# Patient Record
Sex: Female | Born: 1970 | Race: Black or African American | Hispanic: No | Smoking: Never smoker
Health system: Southern US, Community
[De-identification: ages and names within clinical notes are randomized; demographics above are authoritative.]

## PROBLEM LIST (undated history)

## (undated) DIAGNOSIS — R011 Cardiac murmur, unspecified: Secondary | ICD-10-CM

## (undated) DIAGNOSIS — R112 Nausea with vomiting, unspecified: Secondary | ICD-10-CM

## (undated) DIAGNOSIS — Z8759 Personal history of other complications of pregnancy, childbirth and the puerperium: Secondary | ICD-10-CM

## (undated) DIAGNOSIS — Z9889 Other specified postprocedural states: Secondary | ICD-10-CM

## (undated) DIAGNOSIS — D649 Anemia, unspecified: Secondary | ICD-10-CM

## (undated) HISTORY — PX: COLPOSCOPY: SHX161

## (undated) HISTORY — PX: INTRAUTERINE DEVICE INSERTION: SHX323

---

## 2005-12-13 ENCOUNTER — Inpatient Hospital Stay (HOSPITAL_COMMUNITY): Admission: AD | Admit: 2005-12-13 | Discharge: 2005-12-13 | Payer: Self-pay | Admitting: Obstetrics & Gynecology

## 2005-12-19 ENCOUNTER — Inpatient Hospital Stay (HOSPITAL_COMMUNITY): Admission: AD | Admit: 2005-12-19 | Discharge: 2005-12-21 | Payer: Self-pay | Admitting: Obstetrics and Gynecology

## 2006-01-05 ENCOUNTER — Inpatient Hospital Stay (HOSPITAL_COMMUNITY): Admission: AD | Admit: 2006-01-05 | Discharge: 2006-01-05 | Payer: Self-pay | Admitting: Obstetrics & Gynecology

## 2006-01-06 ENCOUNTER — Inpatient Hospital Stay (HOSPITAL_COMMUNITY): Admission: AD | Admit: 2006-01-06 | Discharge: 2006-01-06 | Payer: Self-pay | Admitting: Obstetrics and Gynecology

## 2006-01-07 ENCOUNTER — Inpatient Hospital Stay (HOSPITAL_COMMUNITY): Admission: AD | Admit: 2006-01-07 | Discharge: 2006-01-07 | Payer: Self-pay | Admitting: Obstetrics and Gynecology

## 2008-05-26 ENCOUNTER — Emergency Department (HOSPITAL_BASED_OUTPATIENT_CLINIC_OR_DEPARTMENT_OTHER): Admission: EM | Admit: 2008-05-26 | Discharge: 2008-05-26 | Payer: Self-pay | Admitting: Emergency Medicine

## 2008-10-02 ENCOUNTER — Emergency Department (HOSPITAL_BASED_OUTPATIENT_CLINIC_OR_DEPARTMENT_OTHER): Admission: EM | Admit: 2008-10-02 | Discharge: 2008-10-02 | Payer: Self-pay | Admitting: Emergency Medicine

## 2009-04-08 ENCOUNTER — Emergency Department (HOSPITAL_BASED_OUTPATIENT_CLINIC_OR_DEPARTMENT_OTHER): Admission: EM | Admit: 2009-04-08 | Discharge: 2009-04-09 | Payer: Self-pay | Admitting: Emergency Medicine

## 2010-03-15 ENCOUNTER — Other Ambulatory Visit: Payer: Self-pay | Admitting: Obstetrics & Gynecology

## 2010-03-15 DIAGNOSIS — R928 Other abnormal and inconclusive findings on diagnostic imaging of breast: Secondary | ICD-10-CM

## 2010-03-27 ENCOUNTER — Other Ambulatory Visit: Payer: Self-pay

## 2010-04-11 ENCOUNTER — Ambulatory Visit
Admission: RE | Admit: 2010-04-11 | Discharge: 2010-04-11 | Disposition: A | Discharge status: Home / Self Care | Payer: BC Managed Care – PPO | Source: Ambulatory Visit | Attending: Obstetrics & Gynecology | Admitting: Obstetrics & Gynecology

## 2010-04-11 DIAGNOSIS — R928 Other abnormal and inconclusive findings on diagnostic imaging of breast: Secondary | ICD-10-CM

## 2010-04-16 LAB — URINALYSIS, ROUTINE W REFLEX MICROSCOPIC
Glucose, UA: NEGATIVE mg/dL
Nitrite: POSITIVE — AB
Protein, ur: >300 mg/dL — AB
pH: 6 (ref 5.0–8.0)

## 2010-04-16 LAB — URINE CULTURE

## 2010-07-17 ENCOUNTER — Emergency Department (HOSPITAL_BASED_OUTPATIENT_CLINIC_OR_DEPARTMENT_OTHER)
Admission: EM | Admit: 2010-07-17 | Discharge: 2010-07-17 | Disposition: A | Discharge status: Home / Self Care | Payer: BC Managed Care – PPO | Attending: Emergency Medicine | Admitting: Emergency Medicine

## 2010-07-17 ENCOUNTER — Encounter: Payer: Self-pay | Admitting: *Deleted

## 2010-07-17 DIAGNOSIS — H109 Unspecified conjunctivitis: Secondary | ICD-10-CM | POA: Insufficient documentation

## 2010-07-17 DIAGNOSIS — H5710 Ocular pain, unspecified eye: Secondary | ICD-10-CM | POA: Insufficient documentation

## 2010-07-17 MED ORDER — TOBRAMYCIN 0.3 % OP SOLN: 1.0000 [drp] | OPHTHALMIC | Status: AC

## 2010-07-17 MED ORDER — FLUORESCEIN-BENOXINATE 0.25-0.4 % OP SOLN
1.0000 [drp] | Freq: Once | OPHTHALMIC | Status: DC
  Filled 2010-07-17: qty 5

## 2010-07-17 MED ORDER — FLUORESCEIN SODIUM 1 MG OP STRP
ORAL_STRIP | OPHTHALMIC | Status: AC
  Administered 2010-07-17: 21:00:00
  Filled 2010-07-17: qty 1

## 2010-07-17 NOTE — ED Provider Notes (Signed)
History     Chief Complaint  Patient presents with  . Eye Problem   Patient is a 40 y.o. female presenting with eye problem and eye pain. The history is provided by the patient.  Eye Problem  Associated symptoms include eye redness.  Eye Pain This is a new problem. The current episode started today. The symptoms are aggravated by nothing. She has tried nothing for the symptoms.  Eye Pain This is a new problem. The current episode started today. The symptoms are aggravated by nothing. She has tried nothing for the symptoms.   Pt cleaned floor today and now has irritation to left eye.  Pt has swelling to eye.   History reviewed. No pertinent past medical history.  Past Surgical History  Procedure Date  . Intrauterine device insertion     No family history on file.  History  Substance Use Topics  . Smoking status: Never Smoker   . Smokeless tobacco: Not on file  . Alcohol Use: No    OB History    Grav Para Term Preterm Abortions TAB SAB Ect Mult Living                  Review of Systems  Eyes: Positive for pain, redness and itching.  All other systems reviewed and are negative.    Physical Exam  BP 148/91  Pulse 90  Temp(Src) 98.1 F (36.7 C) (Oral)  SpO2 99%  Physical Exam  Constitutional: She appears well-developed and well-nourished.  HENT:  Head: Normocephalic.  Eyes: Pupils are equal, round, and reactive to light. Left eye exhibits discharge.       Redness left eye,  Swollen  fluro no uptake  Cardiovascular: Normal rate.   Pulmonary/Chest: She is in respiratory distress.    ED Course  Procedures  MDM Pt given tobrex opth.  I advised benadry tonight      Langston Masker, [place] 07/17/10 2203  Medical screening examination/treatment/procedure(s) were performed by non-physician practitioner and as supervising physician I was immediately available for consultation/collaboration.  Toy Baker, MD 07/19/10 1539

## 2010-07-17 NOTE — ED Notes (Signed)
Using latex gloves to clean using clorox spray. Afterward her eyes started itching and burning. Scelara swelling.

## 2010-09-24 LAB — RPR: RPR: NONREACTIVE

## 2010-09-24 LAB — GC/CHLAMYDIA PROBE AMP, GENITAL: Chlamydia: NEGATIVE

## 2010-09-24 LAB — RUBELLA ANTIBODY, IGM: Rubella: IMMUNE

## 2010-09-24 LAB — HIV ANTIBODY (ROUTINE TESTING W REFLEX): HIV: NONREACTIVE

## 2010-09-24 LAB — HEPATITIS B SURFACE ANTIGEN: Hepatitis B Surface Ag: NEGATIVE

## 2010-09-24 LAB — ANTIBODY SCREEN: Antibody Screen: NEGATIVE

## 2011-03-13 LAB — STREP B DNA PROBE: GBS: NEGATIVE

## 2011-03-21 ENCOUNTER — Encounter (HOSPITAL_COMMUNITY): Payer: Self-pay | Admitting: *Deleted

## 2011-03-21 ENCOUNTER — Inpatient Hospital Stay (HOSPITAL_COMMUNITY)
Admission: AD | Admit: 2011-03-21 | Discharge: 2011-03-24 | DRG: 372 | Disposition: A | Discharge status: Home / Self Care | Payer: BC Managed Care – PPO | Source: Ambulatory Visit | Attending: Obstetrics and Gynecology | Admitting: Obstetrics and Gynecology

## 2011-03-21 DIAGNOSIS — O1414 Severe pre-eclampsia complicating childbirth: Principal | ICD-10-CM | POA: Diagnosis present

## 2011-03-21 DIAGNOSIS — O09529 Supervision of elderly multigravida, unspecified trimester: Secondary | ICD-10-CM | POA: Diagnosis present

## 2011-03-21 LAB — COMPREHENSIVE METABOLIC PANEL
ALT: 23 U/L (ref 0–35)
AST: 36 U/L (ref 0–37)
Albumin: 2.4 g/dL — ABNORMAL LOW (ref 3.5–5.2)
CO2: 22 mEq/L (ref 19–32)
Chloride: 103 mEq/L (ref 96–112)
Creatinine, Ser: 0.58 mg/dL (ref 0.50–1.10)
GFR calc non Af Amer: >90 mL/min (ref >90)
Potassium: 3.3 mEq/L — ABNORMAL LOW (ref 3.5–5.1)
Sodium: 138 mEq/L (ref 135–145)
Total Bilirubin: 0.3 mg/dL (ref 0.3–1.2)

## 2011-03-21 LAB — CBC
Hemoglobin: 9 g/dL — ABNORMAL LOW (ref 12.0–15.0)
MCH: 28.4 pg (ref 26.0–34.0)
Platelets: 143 10*3/uL — ABNORMAL LOW (ref 150–400)
RBC: 3.17 MIL/uL — ABNORMAL LOW (ref 3.87–5.11)
WBC: 7.3 10*3/uL (ref 4.0–10.5)

## 2011-03-21 LAB — LACTATE DEHYDROGENASE: LDH: 253 U/L — ABNORMAL HIGH (ref 94–250)

## 2011-03-21 MED ORDER — TERBUTALINE SULFATE 1 MG/ML IJ SOLN: 0.2500 mg | Freq: Once | INTRAMUSCULAR | Status: AC | PRN

## 2011-03-21 MED ORDER — OXYTOCIN 20 UNITS IN LACTATED RINGERS INFUSION - SIMPLE
125.0000 mL/h | Freq: Once | INTRAVENOUS | Status: AC
  Administered 2011-03-22: 125 mL/h via INTRAVENOUS

## 2011-03-21 MED ORDER — ACETAMINOPHEN 325 MG PO TABS
650.0000 mg | ORAL_TABLET | ORAL | Status: DC | PRN
  Administered 2011-03-21: 650 mg via ORAL
  Filled 2011-03-21: qty 2

## 2011-03-21 MED ORDER — MAGNESIUM SULFATE BOLUS VIA INFUSION
4.0000 g | Freq: Once | INTRAVENOUS | Status: AC
  Administered 2011-03-21: 4 g via INTRAVENOUS
  Filled 2011-03-21: qty 500

## 2011-03-21 MED ORDER — OXYCODONE-ACETAMINOPHEN 5-325 MG PO TABS: 1.0000 | ORAL_TABLET | ORAL | Status: DC | PRN

## 2011-03-21 MED ORDER — LACTATED RINGERS IV SOLN
INTRAVENOUS | Status: DC
  Administered 2011-03-21: 97 mL/h via INTRAVENOUS
  Administered 2011-03-22: 94 mL/h via INTRAVENOUS
  Administered 2011-03-22: 12:00:00 via INTRAVENOUS

## 2011-03-21 MED ORDER — ONDANSETRON HCL 4 MG/2ML IJ SOLN: 4.0000 mg | Freq: Four times a day (QID) | INTRAMUSCULAR | Status: DC | PRN

## 2011-03-21 MED ORDER — FLEET ENEMA 7-19 GM/118ML RE ENEM: 1.0000 | ENEMA | RECTAL | Status: DC | PRN

## 2011-03-21 MED ORDER — OXYTOCIN BOLUS FROM INFUSION
500.0000 mL | Freq: Once | INTRAVENOUS | Status: DC
  Filled 2011-03-21: qty 500

## 2011-03-21 MED ORDER — ZOLPIDEM TARTRATE 10 MG PO TABS
10.0000 mg | ORAL_TABLET | Freq: Every evening | ORAL | Status: DC | PRN
  Administered 2011-03-22: 10 mg via ORAL
  Filled 2011-03-21: qty 1

## 2011-03-21 MED ORDER — CITRIC ACID-SODIUM CITRATE 334-500 MG/5ML PO SOLN
30.0000 mL | ORAL | Status: DC | PRN
  Administered 2011-03-21: 30 mL via ORAL
  Filled 2011-03-21: qty 15

## 2011-03-21 MED ORDER — LACTATED RINGERS IV SOLN: 500.0000 mL | INTRAVENOUS | Status: DC | PRN

## 2011-03-21 MED ORDER — IBUPROFEN 600 MG PO TABS: 600.0000 mg | ORAL_TABLET | Freq: Four times a day (QID) | ORAL | Status: DC | PRN

## 2011-03-21 MED ORDER — LIDOCAINE HCL (PF) 1 % IJ SOLN
30.0000 mL | INTRAMUSCULAR | Status: DC | PRN
  Filled 2011-03-21: qty 30

## 2011-03-21 MED ORDER — MAGNESIUM SULFATE 40 G IN LACTATED RINGERS - SIMPLE
2.0000 g/h | INTRAVENOUS | Status: AC
  Administered 2011-03-22: 2 g/h via INTRAVENOUS
  Filled 2011-03-21 (×2): qty 500

## 2011-03-21 MED ORDER — OXYTOCIN 20 UNITS IN LACTATED RINGERS INFUSION - SIMPLE
1.0000 m[IU]/min | INTRAVENOUS | Status: DC
  Administered 2011-03-21: 1 m[IU]/min via INTRAVENOUS
  Filled 2011-03-21: qty 1000

## 2011-03-21 MED ORDER — BUTORPHANOL TARTRATE 2 MG/ML IJ SOLN: 1.0000 mg | INTRAMUSCULAR | Status: DC | PRN

## 2011-03-21 NOTE — H&P (Signed)
41 yo G4P3 presents for IOL.  Pt w/ h/o pre-eclampsia in 2 prior pregnancies presents to office this week with increasing BP.  BP 144-148/80-90s.  Pt denies HA, n/v and visual changes.  No RUQ pain.  + FM  Past History - see hollister, GBS neg  AF, VSS  + FHT Gen - NAD ABd - gravid, NT Ext - 1+ edema, 2+ DTR, no clonus Cvx 3/50/-2 vtx  Plts - 126.  LFTs wnl  A/P;  IUP at 37 weeks with HEELP syndrom. Plan for admit and IOL.  Discussed risks of IOL and risks of prematurity

## 2011-03-21 NOTE — Progress Notes (Signed)
Dr. Jean Rosenthal on unit, updated on pt status, history and labs, discussion of pt plan of care, no further orders at this time

## 2011-03-22 ENCOUNTER — Encounter (HOSPITAL_COMMUNITY): Payer: Self-pay | Admitting: Anesthesiology

## 2011-03-22 ENCOUNTER — Inpatient Hospital Stay (HOSPITAL_COMMUNITY): Payer: BC Managed Care – PPO | Admitting: Anesthesiology

## 2011-03-22 ENCOUNTER — Encounter (HOSPITAL_COMMUNITY): Payer: Self-pay | Admitting: *Deleted

## 2011-03-22 LAB — CBC
Hemoglobin: 9.9 g/dL — ABNORMAL LOW (ref 12.0–15.0)
MCH: 28.5 pg (ref 26.0–34.0)
MCV: 88.5 fL (ref 78.0–100.0)
RBC: 3.47 MIL/uL — ABNORMAL LOW (ref 3.87–5.11)

## 2011-03-22 LAB — MRSA PCR SCREENING: MRSA by PCR: NEGATIVE

## 2011-03-22 LAB — RPR: RPR Ser Ql: NONREACTIVE

## 2011-03-22 MED ORDER — PHENYLEPHRINE 40 MCG/ML (10ML) SYRINGE FOR IV PUSH (FOR BLOOD PRESSURE SUPPORT): 80.0000 ug | PREFILLED_SYRINGE | INTRAVENOUS | Status: DC | PRN

## 2011-03-22 MED ORDER — EPHEDRINE 5 MG/ML INJ
10.0000 mg | INTRAVENOUS | Status: AC | PRN
  Administered 2011-03-22 (×2): 10 mg via INTRAVENOUS
  Filled 2011-03-22: qty 4

## 2011-03-22 MED ORDER — WITCH HAZEL-GLYCERIN EX PADS: 1.0000 "application " | MEDICATED_PAD | CUTANEOUS | Status: DC | PRN

## 2011-03-22 MED ORDER — LACTATED RINGERS IV SOLN: 500.0000 mL | Freq: Once | INTRAVENOUS | Status: DC

## 2011-03-22 MED ORDER — ONDANSETRON HCL 4 MG/2ML IJ SOLN: 4.0000 mg | INTRAMUSCULAR | Status: DC | PRN

## 2011-03-22 MED ORDER — TETANUS-DIPHTH-ACELL PERTUSSIS 5-2.5-18.5 LF-MCG/0.5 IM SUSP
0.5000 mL | Freq: Once | INTRAMUSCULAR | Status: DC
  Filled 2011-03-22: qty 0.5

## 2011-03-22 MED ORDER — OXYCODONE-ACETAMINOPHEN 5-325 MG PO TABS: 1.0000 | ORAL_TABLET | ORAL | Status: DC | PRN

## 2011-03-22 MED ORDER — FENTANYL 2.5 MCG/ML BUPIVACAINE 1/10 % EPIDURAL INFUSION (WH - ANES)
14.0000 mL/h | INTRAMUSCULAR | Status: DC
  Administered 2011-03-22: 14 mL/h via EPIDURAL
  Filled 2011-03-22: qty 60

## 2011-03-22 MED ORDER — EPHEDRINE 5 MG/ML INJ: 10.0000 mg | INTRAVENOUS | Status: DC | PRN

## 2011-03-22 MED ORDER — DIPHENHYDRAMINE HCL 50 MG/ML IJ SOLN: 12.5000 mg | INTRAMUSCULAR | Status: DC | PRN

## 2011-03-22 MED ORDER — MEASLES, MUMPS & RUBELLA VAC ~~LOC~~ INJ
0.5000 mL | INJECTION | Freq: Once | SUBCUTANEOUS | Status: DC
  Filled 2011-03-22: qty 0.5

## 2011-03-22 MED ORDER — PHENYLEPHRINE 40 MCG/ML (10ML) SYRINGE FOR IV PUSH (FOR BLOOD PRESSURE SUPPORT)
80.0000 ug | PREFILLED_SYRINGE | INTRAVENOUS | Status: DC | PRN
  Administered 2011-03-22: 80 ug via INTRAVENOUS
  Filled 2011-03-22: qty 5

## 2011-03-22 MED ORDER — OXYTOCIN 20 UNITS IN LACTATED RINGERS INFUSION - SIMPLE: 1.0000 m[IU]/min | INTRAVENOUS | Status: DC

## 2011-03-22 MED ORDER — DIPHENHYDRAMINE HCL 25 MG PO CAPS
25.0000 mg | ORAL_CAPSULE | Freq: Four times a day (QID) | ORAL | Status: DC | PRN
  Administered 2011-03-23: 25 mg via ORAL
  Filled 2011-03-22: qty 1

## 2011-03-22 MED ORDER — LIDOCAINE HCL (PF) 1 % IJ SOLN
INTRAMUSCULAR | Status: DC | PRN
  Administered 2011-03-22 (×3): 4 mL

## 2011-03-22 MED ORDER — LANOLIN HYDROUS EX OINT: TOPICAL_OINTMENT | CUTANEOUS | Status: DC | PRN

## 2011-03-22 MED ORDER — MEDROXYPROGESTERONE ACETATE 150 MG/ML IM SUSP: 150.0000 mg | INTRAMUSCULAR | Status: DC | PRN

## 2011-03-22 MED ORDER — DIBUCAINE 1 % RE OINT: 1.0000 "application " | TOPICAL_OINTMENT | RECTAL | Status: DC | PRN

## 2011-03-22 MED ORDER — BENZOCAINE-MENTHOL 20-0.5 % EX AERO: 1.0000 "application " | INHALATION_SPRAY | CUTANEOUS | Status: DC | PRN

## 2011-03-22 MED ORDER — LACTATED RINGERS IV SOLN
INTRAVENOUS | Status: DC
  Administered 2011-03-22: 20:00:00 via INTRAVENOUS

## 2011-03-22 MED ORDER — PRENATAL MULTIVITAMIN CH
1.0000 | ORAL_TABLET | Freq: Every day | ORAL | Status: DC
  Administered 2011-03-22 – 2011-03-24 (×3): 1 via ORAL
  Filled 2011-03-22 (×3): qty 1

## 2011-03-22 MED ORDER — SIMETHICONE 80 MG PO CHEW: 80.0000 mg | CHEWABLE_TABLET | ORAL | Status: DC | PRN

## 2011-03-22 MED ORDER — ONDANSETRON HCL 4 MG PO TABS: 4.0000 mg | ORAL_TABLET | ORAL | Status: DC | PRN

## 2011-03-22 MED ORDER — IBUPROFEN 600 MG PO TABS
600.0000 mg | ORAL_TABLET | Freq: Four times a day (QID) | ORAL | Status: DC
  Administered 2011-03-22 – 2011-03-24 (×8): 600 mg via ORAL
  Filled 2011-03-22 (×8): qty 1

## 2011-03-22 MED ORDER — SENNOSIDES-DOCUSATE SODIUM 8.6-50 MG PO TABS
2.0000 | ORAL_TABLET | Freq: Every day | ORAL | Status: DC
  Administered 2011-03-22 – 2011-03-23 (×2): 2 via ORAL

## 2011-03-22 NOTE — Progress Notes (Signed)
03/22/11 1450 Pt arrived via w/c from L&D to AICU #[id number]. Report update from [place], [place]

## 2011-03-22 NOTE — H&P (Signed)
Pt comfortable, feeling mild ctx.  No vb or lof.  No ha, ruq pain or visual changes  AF, VSS  BP 130-140s/90s FHT reassuring Toco Q3-5 Gen - NAD Abd - gravid, NT  EFW 7-7 1/2# Ext 2+ edema bilaterally CVX- 3-4/50/-2 AROM, clear  A/P:  Continue pitocin induction Magnesium for sz prophylaxis

## 2011-03-22 NOTE — Progress Notes (Signed)
SVD of vigerous female infant w/ apgars of 9,9.  Placenta delivered spontaneous w/ 3VC.   No lacerations.  Fundus firm.  EBL 300cc .

## 2011-03-22 NOTE — Progress Notes (Signed)
Comfortable w/ epidural.  Progressed quickly to c/c/+1  FHT reassuring Toco Q2 Cvx c/c/+1  A/P:  Exp mngt Magnesium

## 2011-03-22 NOTE — Progress Notes (Signed)
C/o pain w/ contractions.    FHT reassuring Toco Q3 Cvx 6/90/-2  A/P:  Exp mngt Magnesium Epidural

## 2011-03-22 NOTE — Anesthesia Procedure Notes (Signed)
Epidural Patient location during procedure: OB Start time: 03/22/2011 10:59 AM Reason for block: procedure for pain  Staffing Performed by: anesthesiologist   Preanesthetic Checklist Completed: patient identified, site marked, surgical consent, pre-op evaluation, timeout performed, IV checked, risks and benefits discussed and monitors and equipment checked  Epidural Patient position: sitting Prep: site prepped and draped and DuraPrep Patient monitoring: continuous pulse ox and blood pressure Approach: midline Injection technique: LOR air  Needle:  Needle type: Tuohy  Needle gauge: 17 G Needle length: 9 cm Catheter type: closed end flexible Catheter size: 19 Gauge Test dose: negative  Assessment Events: blood not aspirated, injection not painful, no injection resistance, negative IV test and no paresthesia  Additional Notes Discussed risk of headache, infection, bleeding, nerve injury and failed or incomplete block.  Patient voices understanding and wishes to proceed.

## 2011-03-22 NOTE — Anesthesia Postprocedure Evaluation (Signed)
  Anesthesia Post-op Note  Patient: Suzanne Aguirre  Procedure(s) Performed: * No procedures listed *  Patient Location: PACU and A-ICU  Anesthesia Type: Epidural  Level of Consciousness: awake, alert  and oriented  Airway and Oxygen Therapy: Patient Spontanous Breathing   Post-op Assessment: Patient's Cardiovascular Status Stable and Respiratory Function Stable  Post-op Vital Signs: stable  Complications: No apparent anesthesia complications

## 2011-03-22 NOTE — Anesthesia Preprocedure Evaluation (Signed)
Anesthesia Evaluation  Patient identified by MRN, date of birth, ID band Patient awake    Reviewed: Allergy & Precautions, H&P , NPO status , Patient's Chart, lab work & pertinent test results, reviewed documented beta blocker date and time   History of Anesthesia Complications Negative for: history of anesthetic complications  Airway Mallampati: III TM Distance: >3 FB Neck ROM: full    Dental  (+) Teeth Intact   Pulmonary neg pulmonary ROS,  breath sounds clear to auscultation        Cardiovascular hypertension (preeclampsia on Magnesium), Rhythm:regular Rate:Normal     Neuro/Psych negative neurological ROS  negative psych ROS   GI/Hepatic negative GI ROS, Neg liver ROS,   Endo/Other  negative endocrine ROS  Renal/GU negative Renal ROS     Musculoskeletal   Abdominal   Peds  Hematology  (+) Blood dyscrasia (hgb 9.9), anemia ,   Anesthesia Other Findings   Reproductive/Obstetrics (+) Pregnancy                           Anesthesia Physical Anesthesia Plan  ASA: III  Anesthesia Plan: Epidural   Post-op Pain Management:    Induction:   Airway Management Planned:   Additional Equipment:   Intra-op Plan:   Post-operative Plan:   Informed Consent: I have reviewed the patients History and Physical, chart, labs and discussed the procedure including the risks, benefits and alternatives for the proposed anesthesia with the patient or authorized representative who has indicated his/her understanding and acceptance.     Plan Discussed with:   Anesthesia Plan Comments:         Anesthesia Quick Evaluation

## 2011-03-23 LAB — CBC
MCH: 28.9 pg (ref 26.0–34.0)
MCV: 88.2 fL (ref 78.0–100.0)
Platelets: 133 10*3/uL — ABNORMAL LOW (ref 150–400)
RBC: 3.04 MIL/uL — ABNORMAL LOW (ref 3.87–5.11)
RDW: 14.4 % (ref 11.5–15.5)

## 2011-03-23 NOTE — Progress Notes (Signed)
Post Partum Day 1 Subjective: no complaints, up ad lib and tolerating PO  Objective: Blood pressure 141/87, pulse 93, temperature 97.9 F (36.6 C), temperature source Oral, resp. rate 16, height 5\' 5"  (1.651 m), weight 90.084 kg (198 lb 9.6 oz), last menstrual period 04/05/2010, SpO2 98.00%, unknown if currently breastfeeding.  Physical Exam:  General: alert and cooperative Lochia: appropriate Uterine Fundus: firm Incision: n/a DVT Evaluation: No evidence of DVT seen on physical exam.   Basename 03/23/11 0526 03/22/11 1016  HGB 8.8* 9.9*  HCT 26.8* 30.7*    Assessment/Plan: Continue mag for 24 hrs then discontinue.   CBC stable this am Continue postpartum care  LOS: 2 days   Stanislav Gervase 03/23/2011, 7:01 AM

## 2011-03-23 NOTE — Progress Notes (Signed)
Platelet count called to Dr. Malen Gauze. Order received to remove Epidural.

## 2011-03-24 LAB — CBC
Hemoglobin: 8.2 g/dL — ABNORMAL LOW (ref 12.0–15.0)
MCH: 28.7 pg (ref 26.0–34.0)
MCHC: 31.8 g/dL (ref 30.0–36.0)
Platelets: 128 10*3/uL — ABNORMAL LOW (ref 150–400)
RDW: 14.5 % (ref 11.5–15.5)

## 2011-03-24 MED ORDER — IBUPROFEN 600 MG PO TABS: 600.0000 mg | ORAL_TABLET | Freq: Four times a day (QID) | ORAL | Status: AC

## 2011-03-24 NOTE — Discharge Summary (Signed)
Obstetric Discharge Summary Reason for Admission: onset of labor Prenatal Procedures: ultrasound Intrapartum Procedures: spontaneous vaginal delivery Postpartum Procedures: none Complications-Operative and Postpartum: none Hemoglobin  Date Value Range Status  03/24/2011 8.2* 12.0-15.0 (g/dL) Final     HCT  Date Value Range Status  03/24/2011 25.8* 36.0-46.0 (%) Final    Physical Exam:  General: alert and cooperative Lochia: appropriate Uterine Fundus: firm Perineum intact DVT Evaluation: No evidence of DVT seen on physical exam.  Discharge Diagnoses: Term Pregnancy-delivered and Preelampsia  Discharge Information: Date: 03/24/2011 Activity: pelvic rest Diet: routine Medications: PNV and Ibuprofen Condition: stable Instructions: refer to practice specific booklet Discharge to: home   Newborn Data: Live born female  Birth Weight: 7 lb 0.9 oz (3201 g) APGAR: 9, 9  Home with mother.  CURTIS,CAROL G 03/24/2011, 8:46 AM

## 2011-03-24 NOTE — Progress Notes (Signed)
Post Partum Day 2 Subjective: no complaints, up ad lib, voiding, tolerating PO and + flatus. Denies HA, blurred vision Objective: Blood pressure 135/86, pulse 75, temperature 97.5 F (36.4 C), temperature source Oral, resp. rate 16, height 5\' 5"  (1.651 m), weight 90.084 kg (198 lb 9.6 oz), last menstrual period 04/05/2010, SpO2 99.00%, unknown if currently breastfeeding.  Physical Exam:  General: alert and cooperative Lochia: appropriate Uterine Fundus: firm Incision: perineum intact DVT Evaluation: No evidence of DVT seen on physical exam.   Basename 03/24/11 0557 03/23/11 0526  HGB 8.2* 8.8*  HCT 25.8* 26.8*    Assessment/Plan: Discharge home RTO in 1 week for bp check. PIH precautions reviewed   LOS: 3 days   CURTIS,CAROL G 03/24/2011, 8:39 AM

## 2011-05-30 HISTORY — PX: TUBAL LIGATION: SHX77

## 2011-06-01 ENCOUNTER — Encounter (HOSPITAL_COMMUNITY): Payer: Self-pay | Admitting: Obstetrics and Gynecology

## 2011-06-01 ENCOUNTER — Inpatient Hospital Stay (HOSPITAL_COMMUNITY)
Admission: AD | Admit: 2011-06-01 | Discharge: 2011-06-01 | Disposition: A | Discharge status: Home / Self Care | Payer: BC Managed Care – PPO | Source: Ambulatory Visit | Attending: Obstetrics and Gynecology | Admitting: Obstetrics and Gynecology

## 2011-06-01 DIAGNOSIS — T148XXA Other injury of unspecified body region, initial encounter: Secondary | ICD-10-CM

## 2011-06-01 DIAGNOSIS — IMO0002 Reserved for concepts with insufficient information to code with codable children: Secondary | ICD-10-CM | POA: Insufficient documentation

## 2011-06-01 MED ORDER — OXYCODONE-ACETAMINOPHEN 5-325 MG PO TABS
1.0000 | ORAL_TABLET | Freq: Once | ORAL | Status: AC
  Administered 2011-06-01: 1 via ORAL
  Filled 2011-06-01: qty 1

## 2011-06-01 NOTE — MAU Provider Note (Signed)
  History     CSN: [id number]  Arrival date and time: 06/01/11 4098   First Provider Initiated Contact with Patient 06/01/11 [phone]      Chief Complaint  Patient presents with  . Post-op Problem   HPI c/o incision bleeding. S/P lap tubl  Past Medical History  Diagnosis Date  . No pertinent past medical history   . Hypertension   . Anemia   . Abnormal Pap smear     Past Surgical History  Procedure Date  . Intrauterine device insertion   . Colposcopy   . Tubal ligation     History reviewed. No pertinent family history.  History  Substance Use Topics  . Smoking status: Never Smoker   . Smokeless tobacco: Not on file  . Alcohol Use: No    Allergies:  Allergies  Allergen Reactions  . Doxycycline Hives    Prescriptions prior to admission  Medication Sig Dispense Refill  . HYDROcodone-acetaminophen (NORCO) 5-325 MG per tablet Take 1 tablet by mouth every 6 (six) hours as needed. For pain.      Marland Kitchen norethindrone (MICRONOR,CAMILA,ERRIN) 0.35 MG tablet Take 1 tablet by mouth daily.      . Prenatal Vit-Fe Fumarate-FA (PRENATAL MULTIVITAMIN) TABS Take 1 tablet by mouth every morning.        ROS Physical Exam   Blood pressure 132/74, pulse 66, temperature 97.5 F (36.4 C), temperature source Oral, resp. rate 18, last menstrual period 05/27/2011, currently breastfeeding.  Physical Exam  SOAKED PAD REOVED.  SMALL OF AMOUNT OF BLEEDING.  PRESSURE DRESSING APPLIED WITH RESOLUTION OF BLEEDIMNG  MAU Course  Procedures  MDM   Assessment and Plan  D/C HOME WITH RE INFORCED DRESSING  CALL WITH FURHTER BLEEDING  Kenli Waldo S 06/01/2011, 9:53 AM

## 2011-06-01 NOTE — MAU Note (Signed)
Pt presents to MAU with chief complaint of post-op complications of bilateral tubal ligation. Surgery was done Friday 05/30/11; pt says she began bleeding yesterday around 5:00pm and called the office and they recommended the patient come in for assessment.

## 2013-11-07 ENCOUNTER — Encounter (HOSPITAL_COMMUNITY): Payer: Self-pay | Admitting: Obstetrics and Gynecology

## 2015-05-08 ENCOUNTER — Encounter (HOSPITAL_COMMUNITY): Payer: Self-pay

## 2015-05-08 ENCOUNTER — Observation Stay (HOSPITAL_COMMUNITY)
Admission: EM | Admit: 2015-05-08 | Discharge: 2015-05-09 | Disposition: A | Discharge status: Home / Self Care | Payer: BLUE CROSS/BLUE SHIELD | Attending: Internal Medicine | Admitting: Internal Medicine

## 2015-05-08 DIAGNOSIS — I10 Essential (primary) hypertension: Secondary | ICD-10-CM | POA: Insufficient documentation

## 2015-05-08 DIAGNOSIS — D649 Anemia, unspecified: Principal | ICD-10-CM | POA: Insufficient documentation

## 2015-05-08 DIAGNOSIS — R0789 Other chest pain: Secondary | ICD-10-CM | POA: Insufficient documentation

## 2015-05-08 DIAGNOSIS — R531 Weakness: Secondary | ICD-10-CM | POA: Insufficient documentation

## 2015-05-08 DIAGNOSIS — R0602 Shortness of breath: Secondary | ICD-10-CM | POA: Diagnosis not present

## 2015-05-08 DIAGNOSIS — Z7982 Long term (current) use of aspirin: Secondary | ICD-10-CM | POA: Insufficient documentation

## 2015-05-08 DIAGNOSIS — R55 Syncope and collapse: Secondary | ICD-10-CM | POA: Diagnosis not present

## 2015-05-08 DIAGNOSIS — N920 Excessive and frequent menstruation with regular cycle: Secondary | ICD-10-CM | POA: Diagnosis present

## 2015-05-08 LAB — URINALYSIS, ROUTINE W REFLEX MICROSCOPIC
BILIRUBIN URINE: NEGATIVE
GLUCOSE, UA: NEGATIVE mg/dL
KETONES UR: NEGATIVE mg/dL
Leukocytes, UA: NEGATIVE
Nitrite: NEGATIVE
PH: 7 (ref 5.0–8.0)
PROTEIN: NEGATIVE mg/dL
Specific Gravity, Urine: 1.02 (ref 1.005–1.030)

## 2015-05-08 LAB — CBC
HEMATOCRIT: 25.3 % — AB (ref 36.0–46.0)
Hemoglobin: 7.3 g/dL — ABNORMAL LOW (ref 12.0–15.0)
MCH: 22.1 pg — ABNORMAL LOW (ref 26.0–34.0)
MCHC: 28.9 g/dL — AB (ref 30.0–36.0)
MCV: 76.4 fL — AB (ref 78.0–100.0)
PLATELETS: 186 10*3/uL (ref 150–400)
RBC: 3.31 MIL/uL — ABNORMAL LOW (ref 3.87–5.11)
RDW: 18.6 % — AB (ref 11.5–15.5)
WBC: 5.1 10*3/uL (ref 4.0–10.5)

## 2015-05-08 LAB — RETICULOCYTES
RBC.: 3.18 MIL/uL — ABNORMAL LOW (ref 3.87–5.11)
RETIC COUNT ABSOLUTE: 117.7 10*3/uL (ref 19.0–186.0)
Retic Ct Pct: 3.7 % — ABNORMAL HIGH (ref 0.4–3.1)

## 2015-05-08 LAB — BASIC METABOLIC PANEL
Anion gap: 6 (ref 5–15)
BUN: 7 mg/dL (ref 6–20)
CHLORIDE: 109 mmol/L (ref 101–111)
CO2: 25 mmol/L (ref 22–32)
CREATININE: 0.65 mg/dL (ref 0.44–1.00)
Calcium: 9 mg/dL (ref 8.9–10.3)
GFR calc Af Amer: >60 mL/min (ref >60)
GLUCOSE: 90 mg/dL (ref 65–99)
POTASSIUM: 3.8 mmol/L (ref 3.5–5.1)
SODIUM: 140 mmol/L (ref 135–145)

## 2015-05-08 LAB — URINE MICROSCOPIC-ADD ON

## 2015-05-08 LAB — IRON AND TIBC
IRON: 47 ug/dL (ref 28–170)
Saturation Ratios: 11 % (ref 10.4–31.8)
TIBC: 416 ug/dL (ref 250–450)
UIBC: 369 ug/dL

## 2015-05-08 LAB — FOLATE: FOLATE: 11.1 ng/mL (ref >5.9)

## 2015-05-08 LAB — FERRITIN: FERRITIN: 10 ng/mL — AB (ref 11–307)

## 2015-05-08 LAB — VITAMIN B12: VITAMIN B 12: 227 pg/mL (ref 180–914)

## 2015-05-08 LAB — PREPARE RBC (CROSSMATCH)

## 2015-05-08 LAB — ABO/RH: ABO/RH(D): A POS

## 2015-05-08 LAB — PREGNANCY, URINE: PREG TEST UR: NEGATIVE

## 2015-05-08 MED ORDER — SODIUM CHLORIDE 0.9 % IV SOLN
Freq: Once | INTRAVENOUS | Status: AC
  Administered 2015-05-08: 21:00:00 via INTRAVENOUS

## 2015-05-08 MED ORDER — SODIUM CHLORIDE 0.9 % IV SOLN: 10.0000 mL/h | Freq: Once | INTRAVENOUS | Status: AC

## 2015-05-08 MED ORDER — ONDANSETRON HCL 4 MG/2ML IJ SOLN: 4.0000 mg | Freq: Four times a day (QID) | INTRAMUSCULAR | Status: DC | PRN

## 2015-05-08 MED ORDER — ASPIRIN-ACETAMINOPHEN-CAFFEINE 250-250-65 MG PO TABS
1.0000 | ORAL_TABLET | Freq: Four times a day (QID) | ORAL | Status: DC | PRN
  Administered 2015-05-09: 1 via ORAL
  Filled 2015-05-08 (×3): qty 1

## 2015-05-08 MED ORDER — ACETAMINOPHEN 325 MG PO TABS: 650.0000 mg | ORAL_TABLET | Freq: Four times a day (QID) | ORAL | Status: DC | PRN

## 2015-05-08 MED ORDER — MECLIZINE HCL 25 MG PO TABS
25.0000 mg | ORAL_TABLET | Freq: Three times a day (TID) | ORAL | Status: DC | PRN
  Filled 2015-05-08: qty 1

## 2015-05-08 MED ORDER — ONDANSETRON HCL 4 MG PO TABS: 4.0000 mg | ORAL_TABLET | Freq: Four times a day (QID) | ORAL | Status: DC | PRN

## 2015-05-08 MED ORDER — VITAMIN D (ERGOCALCIFEROL) 1.25 MG (50000 UNIT) PO CAPS
50000.0000 [IU] | ORAL_CAPSULE | ORAL | Status: DC
  Administered 2015-05-09: 50000 [IU] via ORAL
  Filled 2015-05-08: qty 1

## 2015-05-08 MED ORDER — FAMOTIDINE 20 MG PO TABS
20.0000 mg | ORAL_TABLET | Freq: Two times a day (BID) | ORAL | Status: DC
  Administered 2015-05-08 – 2015-05-09 (×2): 20 mg via ORAL
  Filled 2015-05-08 (×2): qty 1

## 2015-05-08 MED ORDER — SODIUM CHLORIDE 0.9% FLUSH
3.0000 mL | Freq: Two times a day (BID) | INTRAVENOUS | Status: DC
  Administered 2015-05-08 – 2015-05-09 (×2): 3 mL via INTRAVENOUS

## 2015-05-08 MED ORDER — DOCUSATE SODIUM 100 MG PO CAPS
100.0000 mg | ORAL_CAPSULE | Freq: Two times a day (BID) | ORAL | Status: DC
  Administered 2015-05-08 – 2015-05-09 (×2): 100 mg via ORAL
  Filled 2015-05-08 (×2): qty 1

## 2015-05-08 MED ORDER — SODIUM CHLORIDE 0.9 % IV SOLN: 10.0000 mL/h | Freq: Once | INTRAVENOUS | Status: DC

## 2015-05-08 MED ORDER — FERROUS SULFATE 325 (65 FE) MG PO TABS
325.0000 mg | ORAL_TABLET | Freq: Three times a day (TID) | ORAL | Status: DC
  Administered 2015-05-09 (×2): 325 mg via ORAL
  Filled 2015-05-08 (×2): qty 1

## 2015-05-08 MED ORDER — ALPRAZOLAM 0.25 MG PO TABS: 0.2500 mg | ORAL_TABLET | Freq: Every evening | ORAL | Status: DC | PRN

## 2015-05-08 NOTE — ED Provider Notes (Signed)
Patient seen and evaluated. Symptomatically with chest tightness and shortness of breath with exertion. Hemoglobin 6.6 in the office. Reports menorrhagia. Agree with admission. I discussed risks and benefits of blood transfusion with her. She consents. Not tachycardic, or unstable.  Tanna Furry, MD 05/08/15 1945

## 2015-05-08 NOTE — H&P (Signed)
History and Physical    Suzanne LALUZ [id number] DOB: 1970-01-14 DOA: 05/08/2015  Referring MD/NP/PA: Leata Mouse, M.D. PCP: No primary care provider on file.  Outpatient Specialists:  Patient coming from: Home.  Chief Complaint: Anemia and chest tightness.  HPI: Suzanne Aguirre is a 45 Aguirre.o. female with medical history significant of hypermenorrhea, chronic anemia, hypertension who comes in the emergency department after being notified by her PCP of the hemoglobin level of 6.9 g/dL.  She has stays up for the past few weeks she has been noticing increased tiredness and fatigue. In the last few days she is having chest tightness associated with shortness of breath with exertion and dizziness. She denies nausea, diaphoresis, PND, orthopnea or pitting edema of the lower extremities. She denies fever, chills, productive cough, wheezing or hemoptysis.  The patient states that she has had heavy menses through her life. Menstrual periods usually last about 7 days. She was not taking iron supplementation until recently. Sometimes she has mild to moderate dysmenorrhea during periods.  ED Course: In the ER, the patient's hemoglobin level was rechecked at 7.3 g/dL. Given her symptoms, 2 units of packed RBCs were ordered. She is also being admitted for further evaluation of chest pain.  Review of Systems: As per HPI otherwise 10 point review of systems negative.    Past Medical History  Diagnosis Date  . No pertinent past medical history   . Hypertension   . Anemia   . Abnormal Pap smear     Past Surgical History  Procedure Laterality Date  . Intrauterine device insertion    . Colposcopy    . Tubal ligation       reports that she has never smoked. She does not have any smokeless tobacco history on file. She reports that she does not drink alcohol or use illicit drugs.  Allergies  Allergen Reactions  . Fish Allergy Anaphylaxis  . Doxycycline Hives    Family History    Problem Relation Age of Onset  . Hypertension Mother   . Hypertension Father   . Diabetes Mellitus II Father   . Prostate cancer Father   . Hypertension Paternal Grandmother   . Hypertension Paternal Grandfather      Prior to Admission medications   Medication Sig Start Date End Date Taking? Authorizing Provider  aspirin-acetaminophen-caffeine (EXCEDRIN MIGRAINE) [phone] MG tablet Take 1 tablet by mouth every 6 (six) hours as needed for headache.   Yes Historical Provider, MD  FERROUS FUMARATE PO Take 1 tablet by mouth daily.   Yes Historical Provider, MD  meclizine (ANTIVERT) 25 MG tablet Take 25 mg by mouth 3 (three) times daily as needed for dizziness.   Yes Historical Provider, MD  Vitamin D, Ergocalciferol, (DRISDOL) 50000 units CAPS capsule Take 50,000 Units by mouth every 7 (seven) days.   Yes Historical Provider, MD    Physical Exam: Filed Vitals:   05/08/15 1830 05/08/15 1915 05/08/15 1957 05/08/15 2016  BP: 130/84 120/78 123/66 130/70  Pulse: 78  87 96  Temp:   98.3 F (36.8 C) 98.5 F (36.9 C)  TempSrc:    Oral  Resp: 22 13 13 17   SpO2: 100%  100% 100%      Constitutional: NAD, calm, comfortable Filed Vitals:   05/08/15 1830 05/08/15 1915 05/08/15 1957 05/08/15 2016  BP: 130/84 120/78 123/66 130/70  Pulse: 78  87 96  Temp:   98.3 F (36.8 C) 98.5 F (36.9 C)  TempSrc:    Oral  Resp: 22 13 13 17   SpO2: 100%  100% 100%   Eyes: PERRL, lids and conjunctivae are pale. ENMT: Mucous membranes are moist. Posterior pharynx clear of any exudate or lesions.Normal dentition.  Neck: normal, supple, no masses, no thyromegaly Respiratory: clear to auscultation bilaterally, no wheezing, no crackles. Normal respiratory effort. No accessory muscle use.  Cardiovascular: Regular rate and rhythm, positive 1/6 systolic murmur / no rubs / no gallops. No extremity edema. 2+ pedal pulses. No carotid bruits.  Abdomen: no tenderness, no masses palpated. No hepatosplenomegaly.  Bowel sounds positive.  Musculoskeletal: no clubbing / cyanosis. No joint deformity upper and lower extremities. Good ROM, no contractures. Normal muscle tone.  Skin: no rashes, lesions, ulcers. No induration Neurologic: CN 2-12 grossly intact. Sensation intact, DTR normal. Strength 5/5 in all 4.  Psychiatric: Normal judgment and insight. Alert and oriented x 3. Normal mood.    Labs on Admission: I have personally reviewed following labs and imaging studies  CBC:  Recent Labs Lab 05/08/15 1428  WBC 5.1  HGB 7.3*  HCT 25.3*  MCV 76.4*  PLT [id number]   Basic Metabolic Panel:  Recent Labs Lab 05/08/15 1428  NA 140  K 3.8  CL 109  CO2 25  GLUCOSE 90  BUN 7  CREATININE 0.65  CALCIUM 9.0   Anemia Panel:  Recent Labs  05/08/15 1931  RETICCTPCT 3.7*   Urine analysis:    Component Value Date/Time   COLORURINE YELLOW 05/08/2015 1848   APPEARANCEUR CLEAR 05/08/2015 1848   LABSPEC 1.020 05/08/2015 1848   PHURINE 7.0 05/08/2015 1848   GLUCOSEU NEGATIVE 05/08/2015 1848   HGBUR MODERATE* 05/08/2015 1848   BILIRUBINUR NEGATIVE 05/08/2015 1848   KETONESUR NEGATIVE 05/08/2015 1848   PROTEINUR NEGATIVE 05/08/2015 1848   UROBILINOGEN 1.0 05/26/2008 0017   NITRITE NEGATIVE 05/08/2015 1848   LEUKOCYTESUR NEGATIVE 05/08/2015 1848    EKG: Independently reviewed.  No EKG available at this time. Ordered and is still pending.  Assessment/Plan Principal Problem:   Symptomatic anemia.   Hypermenorrhea. Admit to telemetry/observation Continue packed RBC transfusion. Follow-up anemia profile results. Follow H&H in the morning. Advised the patient about importance of having iron supplementation. Discussed briefly with the patient that the options of hysterectomy, endometrial ablation and medical treatment with progestins.  Active Problems:   Chest pressure Check EKG. Check troponin levels. Check echocardiogram in the morning.    Hypertension Per patient, she only had  hypertension briefly during one pregnancy. I advised her that her blood pressure to increase once her hemoglobin count returns to normal and this should be monitored closely.   DVT prophylaxis: SCDs. Code Status: Full code. Family Communication:  Disposition Plan: Admit for blood transfusion and chest pain evaluation. Consults called: None. Admission status: Observation/telemetry.   Reubin Milan MD [facility] Pager [phone].  If 7PM-7AM, please contact night-coverage [email or web address] Password [email or web address]  05/08/2015, 8:21 PM

## 2015-05-08 NOTE — ED Provider Notes (Signed)
CSN: [id number]     Arrival date & time 05/08/15  1407 History   First MD Initiated Contact with Patient 05/08/15 1632     Chief Complaint  Patient presents with  . blood transfusion   . Weakness     (Consider location/radiation/quality/duration/timing/severity/associated sxs/prior Treatment) HPI Patient is an otherwise healthy 45 year old female with a history of heavy vaginal bleeding with her periods, chronic anemia as a result, who presents after being urged to come here by her primary care doctor. She has been feeling weak and tired, and near syncopal with standing for approximately a week to week and a half. She has had heavy periods, for a number of years. She has been evaluated by OB/GYN for this, as an IUD inserted, but this is not improved much. Today she saw her primary care doctor who checked CBC after injuring her symptoms, and called her telling her to come to the emergency department because it was 6.8, and she would need a blood transfusion. She reports she has had dark stools, but this began after starting iron approximately a week ago for her anemia. She denies ever having had dark stools prior to this, has had no bright red blood in her stool. She has had no nausea, vomiting, abdominal pain, or hematemesis.   Past Medical History  Diagnosis Date  . No pertinent past medical history   . Hypertension   . Anemia   . Abnormal Pap smear    Past Surgical History  Procedure Laterality Date  . Intrauterine device insertion    . Colposcopy    . Tubal ligation     Family History  Problem Relation Age of Onset  . Hypertension Mother   . Hypertension Father   . Diabetes Mellitus II Father   . Prostate cancer Father   . Hypertension Paternal Grandmother   . Hypertension Paternal Grandfather    Social History  Substance Use Topics  . Smoking status: Never Smoker   . Smokeless tobacco: None  . Alcohol Use: No   OB History    Gravida Para Term Preterm AB TAB SAB Ectopic  Multiple Living   4 4 4       4      Review of Systems  Genitourinary: Positive for vaginal bleeding. Negative for dysuria, urgency, flank pain, vaginal discharge, enuresis, vaginal pain, menstrual problem and pelvic pain.  All other systems reviewed and are negative.     Allergies  Fish allergy and Doxycycline  Home Medications   Prior to Admission medications   Medication Sig Start Date End Date Taking? Authorizing Provider  aspirin-acetaminophen-caffeine (EXCEDRIN MIGRAINE) [phone] MG tablet Take 1 tablet by mouth every 6 (six) hours as needed for headache.   Yes Historical Provider, MD  FERROUS FUMARATE PO Take 1 tablet by mouth daily.   Yes Historical Provider, MD  meclizine (ANTIVERT) 25 MG tablet Take 25 mg by mouth 3 (three) times daily as needed for dizziness.   Yes Historical Provider, MD  Vitamin D, Ergocalciferol, (DRISDOL) 50000 units CAPS capsule Take 50,000 Units by mouth every 7 (seven) days.   Yes Historical Provider, MD   BP 97/61 mmHg  Pulse 75  Temp(Src) 98.1 F (36.7 C) (Oral)  Resp 16  Ht 5\' 5"  (1.651 m)  Wt 73.165 kg  BMI 26.84 kg/m2  SpO2 100% Physical Exam  Constitutional: She is oriented to person, place, and time. She appears well-developed and well-nourished.  HENT:  Head: Normocephalic and atraumatic.  Eyes:  Pale conjunctiva  Neck: No JVD present.  Cardiovascular: Normal rate, regular rhythm and normal heart sounds.   Pulmonary/Chest: Breath sounds normal. No respiratory distress.  Abdominal: Soft. There is no tenderness.  Genitourinary: Vagina normal.  Pulled vaginal blood, no active hemorrhage  Musculoskeletal: She exhibits no edema or tenderness.  Neurological: She is alert and oriented to person, place, and time.  Skin: Skin is warm and dry.  Psychiatric: She has a normal mood and affect.  Nursing note and vitals reviewed.   ED Course  Procedures (including critical care time) Labs Review Labs Reviewed  CBC - Abnormal; Notable  for the following:    RBC 3.31 (*)    Hemoglobin 7.3 (*)    HCT 25.3 (*)    MCV 76.4 (*)    MCH 22.1 (*)    MCHC 28.9 (*)    RDW 18.6 (*)    All other components within normal limits  URINALYSIS, ROUTINE W REFLEX MICROSCOPIC (NOT AT [facility]) - Abnormal; Notable for the following:    Hgb urine dipstick MODERATE (*)    All other components within normal limits  FERRITIN - Abnormal; Notable for the following:    Ferritin 10 (*)    All other components within normal limits  RETICULOCYTES - Abnormal; Notable for the following:    Retic Ct Pct 3.7 (*)    RBC. 3.18 (*)    All other components within normal limits  URINE MICROSCOPIC-ADD ON - Abnormal; Notable for the following:    Squamous Epithelial / LPF 0-5 (*)    Bacteria, UA RARE (*)    All other components within normal limits  BASIC METABOLIC PANEL  PREGNANCY, URINE  VITAMIN B12  FOLATE  IRON AND TIBC  CBC WITH DIFFERENTIAL/PLATELET  PROTIME-INR  CBC  TROPONIN I  TROPONIN I  MAGNESIUM  TYPE AND SCREEN  ABO/RH  PREPARE RBC (CROSSMATCH)  PREPARE RBC (CROSSMATCH)    Imaging Review No results found. I have personally reviewed and evaluated these images and lab results as part of my medical decision-making.   EKG Interpretation None      MDM   Final diagnoses:  Symptomatic anemia    Patient presented to symptomatic anemia. She has no history concerning for GI bleeding, and has a history of heavy periods, which she has been having now. She has no evidence of active, brisk hemorrhage at this point, however is still actively having her period. Her hemoglobin is 7.2 here, but is reported at 6.8 as an outpatient. Considering she is still bleeding, symptomatically for anemia, will elect to transfuse 2 units of blood now, and send anemia panel, and admit for observation. She has outpatient follow-up scheduled with OB/GYN.   Leata Mouse, MD 05/09/15 [id number]  Tanna Furry, MD 05/09/15 [phone]

## 2015-05-08 NOTE — ED Notes (Signed)
Patient sent from doctors office for Hgb 6.9. Reports heavy periods and history of anemia. Denies pain but complains of nausea

## 2015-05-09 ENCOUNTER — Observation Stay (HOSPITAL_BASED_OUTPATIENT_CLINIC_OR_DEPARTMENT_OTHER): Payer: BLUE CROSS/BLUE SHIELD

## 2015-05-09 DIAGNOSIS — N920 Excessive and frequent menstruation with regular cycle: Secondary | ICD-10-CM

## 2015-05-09 DIAGNOSIS — R0789 Other chest pain: Secondary | ICD-10-CM

## 2015-05-09 DIAGNOSIS — R011 Cardiac murmur, unspecified: Secondary | ICD-10-CM

## 2015-05-09 DIAGNOSIS — D649 Anemia, unspecified: Secondary | ICD-10-CM | POA: Diagnosis not present

## 2015-05-09 DIAGNOSIS — I10 Essential (primary) hypertension: Secondary | ICD-10-CM | POA: Diagnosis not present

## 2015-05-09 LAB — ECHOCARDIOGRAM COMPLETE
Height: 65 in
WEIGHTICAEL: 2580.8 [oz_av]

## 2015-05-09 LAB — CBC
HCT: 33.6 % — ABNORMAL LOW (ref 36.0–46.0)
Hemoglobin: 10.2 g/dL — ABNORMAL LOW (ref 12.0–15.0)
MCH: 24.1 pg — ABNORMAL LOW (ref 26.0–34.0)
MCHC: 30.4 g/dL (ref 30.0–36.0)
MCV: 79.2 fL (ref 78.0–100.0)
PLATELETS: 195 10*3/uL (ref 150–400)
RBC: 4.24 MIL/uL (ref 3.87–5.11)
RDW: 18.1 % — ABNORMAL HIGH (ref 11.5–15.5)
WBC: 8.3 10*3/uL (ref 4.0–10.5)

## 2015-05-09 LAB — TYPE AND SCREEN
ABO/RH(D): A POS
Antibody Screen: NEGATIVE
Unit division: 0
Unit division: 0

## 2015-05-09 LAB — CBC WITH DIFFERENTIAL/PLATELET
BASOS ABS: 0.1 10*3/uL (ref 0.0–0.1)
BASOS PCT: 1 %
EOS ABS: 0.2 10*3/uL (ref 0.0–0.7)
EOS PCT: 2 %
HCT: 33.1 % — ABNORMAL LOW (ref 36.0–46.0)
Hemoglobin: 10.1 g/dL — ABNORMAL LOW (ref 12.0–15.0)
LYMPHS PCT: 22 %
Lymphs Abs: 1.7 10*3/uL (ref 0.7–4.0)
MCH: 24.1 pg — ABNORMAL LOW (ref 26.0–34.0)
MCHC: 30.5 g/dL (ref 30.0–36.0)
MCV: 79 fL (ref 78.0–100.0)
Monocytes Absolute: 0.4 10*3/uL (ref 0.1–1.0)
Monocytes Relative: 6 %
Neutro Abs: 5.6 10*3/uL (ref 1.7–7.7)
Neutrophils Relative %: 70 %
PLATELETS: 182 10*3/uL (ref 150–400)
RBC: 4.19 MIL/uL (ref 3.87–5.11)
RDW: 18.1 % — ABNORMAL HIGH (ref 11.5–15.5)
WBC: 8 10*3/uL (ref 4.0–10.5)

## 2015-05-09 LAB — TROPONIN I: Troponin I: <0.03 ng/mL (ref <0.031)

## 2015-05-09 LAB — MAGNESIUM: MAGNESIUM: 1.9 mg/dL (ref 1.7–2.4)

## 2015-05-09 LAB — PROTIME-INR
INR: 1.07 (ref 0.00–1.49)
PROTHROMBIN TIME: 14.1 s (ref 11.6–15.2)

## 2015-05-09 MED ORDER — PANTOPRAZOLE SODIUM 40 MG PO TBEC: 40.0000 mg | DELAYED_RELEASE_TABLET | Freq: Every day | ORAL | Status: DC

## 2015-05-09 NOTE — Progress Notes (Signed)
Spoke with Dr. Grandville Silos about pt's echo not being scheduled until tomorrow. Provider to round on pt and update me on the plan. Will continue to monitor pt.

## 2015-05-09 NOTE — Discharge Summary (Signed)
Physician Discharge Summary  Suzanne Aguirre [id number] DOB: 1970/10/04 DOA: 05/08/2015  PCP: Suzanne Axe, MD  Admit date: 05/08/2015 Discharge date: 05/09/2015  Time spent: 65 minutes  Recommendations for Outpatient Follow-up:  1. Follow up with Suzanne Axe, MD in 1-2 weeks. Needs CBC on follow-up. 2. Follow up with OBGYN in 1-2 weeks.   Discharge Diagnoses:  Principal Problem:   Symptomatic anemia Active Problems:   Chest pressure   Hypertension   Hypermenorrhea   Discharge Condition: Stable and improved  Diet recommendation: Regular  Filed Weights   05/08/15 2109  Weight: 73.165 kg (161 lb 4.8 oz)    History of present illness:  Per Dr. Jolayne Aguirre is a 45 y.o. female with medical history significant of hypermenorrhea, chronic anemia, hypertension who came in the emergency department after being notified by her PCP of the hemoglobin level of 6.9 g/dL.  She had stayed up for the past few weeks, she had been noticing increased tiredness and fatigue. In the last few days she was having chest tightness associated with shortness of breath with exertion and dizziness. She denied nausea, diaphoresis, PND, orthopnea or pitting edema of the lower extremities. She denied fever, chills, productive cough, wheezing or hemoptysis.  The patient stated that she has had heavy menses through her life. Menstrual periods usually last about 7 days. She was not taking iron supplementation until recently. Sometimes she has mild to moderate dysmenorrhea during periods.  ED Course: In the ER, the patient's hemoglobin level was rechecked at 7.3 g/dL. Given her symptoms, 2 units of packed RBCs were ordered. She was also being admitted for further evaluation of chest pain.  Hospital Course:  #1 symptomatic anemia Patient had presented with symptomatic anemia with tiredness and fatigue as well as some chest tightness however denies any chest pressure. Likely secondary to  menometrorrhagia Patient was sent from PCPs office with a hemoglobin of 6.9. Repeat hemoglobin in the emergency room was 7.3. Anemia panel obtained had a iron of 47 and a ferritin of 10. EKG obtained showed a normal sinus rhythm. Patient was admitted and transfused 2 units packed red blood cells with hemoglobin coming up to 10.1 by day of discharge. 2-D echo which was done had a EF of 60-65% with no wall motion abnormalities, mild mitral valve regurgitation. Patient improved clinically and patient was maintained on oral iron supplementation. Patient be discharged home in stable and improved condition and is to follow-up with PCP and OB/GYN as outpatient for further evaluation and management.  #2 chest pain Per admitting physician patient had complaints of chest tightness with associated shortness of breath on exertion and dizziness. Patient however denied any significant chest pressure. Cardiac enzymes which was cycled were negative 2. EKG showed a normal sinus rhythm with no ischemic changes. 2-D echo had a EF of 60-65% with no wall motion abnormalities and mild mitral valvular regurgitation. Patient remained chest pain-free throughout the hospitalization and patient be discharged in stable and improved condition. PPI. Outpatient follow-up.  #3 ??? hypertension Patient's blood pressure remained stable throughout the hospitalization and blood pressure ranged from 97/61 2 139/88. Outpatient follow-up.  Procedures:  2-D echo 05/09/2015  2 units packed red blood cells 05/09/2015  Consultations:  None  Discharge Exam: Filed Vitals:   05/09/15 0218 05/09/15 0609  BP: 125/74 126/55  Pulse: 71 77  Temp: 98 F (36.7 C) 98.3 F (36.8 C)  Resp: 16 16    General: NAD Cardiovascular: RRR Respiratory: CTAB  Discharge Instructions  Discharge Instructions    Diet general    Complete by:  As directed      Discharge instructions    Complete by:  As directed   Follow up with OBGYN as  scheduled or in 1-2 weeks. Follow up with PCP in 1-2 weeks.     Increase activity slowly    Complete by:  As directed           Current Discharge Medication List    START taking these medications   Details  pantoprazole (PROTONIX) 40 MG tablet Take 1 tablet (40 mg total) by mouth daily. Qty: 30 tablet, Refills: 0      CONTINUE these medications which have NOT CHANGED   Details  aspirin-acetaminophen-caffeine (EXCEDRIN MIGRAINE) 250-250-65 MG tablet Take 1 tablet by mouth every 6 (six) hours as needed for headache.    FERROUS FUMARATE PO Take 1 tablet by mouth daily.    meclizine (ANTIVERT) 25 MG tablet Take 25 mg by mouth 3 (three) times daily as needed for dizziness.    Vitamin D, Ergocalciferol, (DRISDOL) 50000 units CAPS capsule Take 50,000 Units by mouth every 7 (seven) days.       Allergies  Allergen Reactions  . Fish Allergy Anaphylaxis  . Doxycycline Hives   Follow-up Information    Follow up with Suzanne Axe, MD. Schedule an appointment as soon as possible for a visit in 1 week.   Specialty:  Family Medicine   Why:  f/u in 1-2 weeks.   Contact information:   Suzanne Aguirre [id number] [phone]       Please follow up.   Why:  f/u with OBGYN in 1-2 weeks.       The results of significant diagnostics from this hospitalization (including imaging, microbiology, ancillary and laboratory) are listed below for reference.    Significant Diagnostic Studies: No results found.  Microbiology: No results found for this or any previous visit (from the past 240 hour(s)).   Labs: Basic Metabolic Panel:  Recent Labs Lab 05/08/15 1428 05/09/15 0607  NA 140  --   K 3.8  --   CL 109  --   CO2 25  --   GLUCOSE 90  --   BUN 7  --   CREATININE 0.65  --   CALCIUM 9.0  --   MG  --  1.9   Liver Function Tests: No results for input(s): AST, ALT, ALKPHOS, BILITOT, PROT, ALBUMIN in the last 168 hours. No results for input(s): LIPASE, AMYLASE in  the last 168 hours. No results for input(s): AMMONIA in the last 168 hours. CBC:  Recent Labs Lab 05/08/15 1428 05/09/15 0502 05/09/15 0607  WBC 5.1 8.3 8.0  NEUTROABS  --   --  5.6  HGB 7.3* 10.2* 10.1*  HCT 25.3* 33.6* 33.1*  MCV 76.4* 79.2 79.0  PLT 186 195 182   Cardiac Enzymes:  Recent Labs Lab 05/09/15 0502 05/09/15 0607  TROPONINI <0.03 <0.03   BNP: BNP (last 3 results) No results for input(s): BNP in the last 8760 hours.  ProBNP (last 3 results) No results for input(s): PROBNP in the last 8760 hours.  CBG: No results for input(s): GLUCAP in the last 168 hours.     SignedIrine Seal MD.  [facility] 05/09/2015, 6:51 PM

## 2015-05-09 NOTE — Progress Notes (Signed)
Paged provider Grandville Silos to inform of pt's test results being in. Awaiting further orders in regard to pt being discharged. Will continue to monitor pt.

## 2015-05-09 NOTE — Progress Notes (Signed)
  Echocardiogram 2D Echocardiogram has been performed.  Jennette Dubin 05/09/2015, 3:38 PM

## 2015-05-09 NOTE — Progress Notes (Signed)
NURSING PROGRESS NOTE  NESLY MERRIFIELD [id number] Discharge Data: 05/09/2015 6:51 PM Attending Provider: Eugenie Filler, MD [id number], Florentina Jenny, MD     Bryson Dames to be D/C'd Home per Thompson,MD order.  Discussed with the patient the After Visit Summary and all questions fully answered. All IV's discontinued with no bleeding noted. All belongings returned to patient for patient to take home. Pt also given prescription for protonix at discharge. Pt taken downstairs via wheelchair accompanied by a staff member.  Last Vital Signs:  Blood pressure 126/55, pulse 77, temperature 98.3 F (36.8 C), temperature source Oral, resp. rate 16, height 5\' 5"  (1.651 m), weight 73.165 kg (161 lb 4.8 oz), SpO2 100 %, currently breastfeeding.  Discharge Medication List   Medication List    TAKE these medications        aspirin-acetaminophen-caffeine 250-250-65 MG tablet  Commonly known as:  EXCEDRIN MIGRAINE  Take 1 tablet by mouth every 6 (six) hours as needed for headache.     FERROUS FUMARATE PO  Take 1 tablet by mouth daily.     meclizine 25 MG tablet  Commonly known as:  ANTIVERT  Take 25 mg by mouth 3 (three) times daily as needed for dizziness.     pantoprazole 40 MG tablet  Commonly known as:  PROTONIX  Take 1 tablet (40 mg total) by mouth daily.     Vitamin D (Ergocalciferol) 50000 units Caps capsule  Commonly known as:  DRISDOL  Take 50,000 Units by mouth every 7 (seven) days.

## 2015-06-01 ENCOUNTER — Encounter (HOSPITAL_BASED_OUTPATIENT_CLINIC_OR_DEPARTMENT_OTHER): Payer: Self-pay | Admitting: Emergency Medicine

## 2015-06-01 ENCOUNTER — Emergency Department (HOSPITAL_BASED_OUTPATIENT_CLINIC_OR_DEPARTMENT_OTHER)
Admission: EM | Admit: 2015-06-01 | Discharge: 2015-06-01 | Disposition: A | Discharge status: Home / Self Care | Payer: BLUE CROSS/BLUE SHIELD | Attending: Emergency Medicine | Admitting: Emergency Medicine

## 2015-06-01 DIAGNOSIS — Z791 Long term (current) use of non-steroidal anti-inflammatories (NSAID): Secondary | ICD-10-CM | POA: Diagnosis not present

## 2015-06-01 DIAGNOSIS — Z79899 Other long term (current) drug therapy: Secondary | ICD-10-CM | POA: Diagnosis not present

## 2015-06-01 DIAGNOSIS — I10 Essential (primary) hypertension: Secondary | ICD-10-CM | POA: Diagnosis not present

## 2015-06-01 DIAGNOSIS — K047 Periapical abscess without sinus: Secondary | ICD-10-CM | POA: Insufficient documentation

## 2015-06-01 DIAGNOSIS — R220 Localized swelling, mass and lump, head: Secondary | ICD-10-CM | POA: Diagnosis present

## 2015-06-01 MED ORDER — ACETAMINOPHEN-CODEINE #3 300-30 MG PO TABS: 1.0000 | ORAL_TABLET | Freq: Four times a day (QID) | ORAL | Status: DC | PRN

## 2015-06-01 MED ORDER — PENICILLIN V POTASSIUM 500 MG PO TABS: 500.0000 mg | ORAL_TABLET | Freq: Four times a day (QID) | ORAL | Status: AC

## 2015-06-01 NOTE — ED Provider Notes (Signed)
CSN: [id number]     Arrival date & time 06/01/15  [id number] History   First MD Initiated Contact with Patient 06/01/15 0935     Chief Complaint  Patient presents with  . Oral Swelling     (Consider location/radiation/quality/duration/timing/severity/associated sxs/prior Treatment) HPI   Patient presents with left lower dental pain and adjacent facial swelling.  States she has bruxism and has broken back molars.  Yesterday her left lower molar began aching, then today is was very swollen and throbbing.  Has been taking ibuprofen (600mg  x 1) and washing with peroxide.  She has an appointment with a dentist in 5 days.  Denies fevers, sore throat, difficulty swallowing or breathing.     Past Medical History  Diagnosis Date  . No pertinent past medical history   . Hypertension   . Anemia   . Abnormal Pap smear    Past Surgical History  Procedure Laterality Date  . Intrauterine device insertion    . Colposcopy    . Tubal ligation     Family History  Problem Relation Age of Onset  . Hypertension Mother   . Hypertension Father   . Diabetes Mellitus II Father   . Prostate cancer Father   . Hypertension Paternal Grandmother   . Hypertension Paternal Grandfather    Social History  Substance Use Topics  . Smoking status: Never Smoker   . Smokeless tobacco: None  . Alcohol Use: No   OB History    Gravida Para Term Preterm AB TAB SAB Ectopic Multiple Living   4 4 4       4      Review of Systems  Constitutional: Negative for fever and chills.  HENT: Positive for dental problem and facial swelling. Negative for congestion, drooling, sore throat and trouble swallowing.   Respiratory: Negative for shortness of breath, wheezing and stridor.   Musculoskeletal: Negative for neck pain and neck stiffness.  Skin: Negative for color change.  Allergic/Immunologic: Negative for immunocompromised state.  Psychiatric/Behavioral: Negative for self-injury.      Allergies  Fish allergy and  Doxycycline  Home Medications   Prior to Admission medications   Medication Sig Start Date End Date Taking? Authorizing Provider  FERROUS FUMARATE PO Take 1 tablet by mouth daily.   Yes Historical Provider, MD  ibuprofen (ADVIL,MOTRIN) 200 MG tablet Take 200 mg by mouth every 6 (six) hours as needed.   Yes Historical Provider, MD  acetaminophen-codeine (TYLENOL #3) 300-30 MG tablet Take 1-2 tablets by mouth every 6 (six) hours as needed for moderate pain. 06/01/15   Clayton Bibles, PA-C  aspirin-acetaminophen-caffeine (EXCEDRIN MIGRAINE) [phone] MG tablet Take 1 tablet by mouth every 6 (six) hours as needed for headache.    Historical Provider, MD  meclizine (ANTIVERT) 25 MG tablet Take 25 mg by mouth 3 (three) times daily as needed for dizziness.    Historical Provider, MD  pantoprazole (PROTONIX) 40 MG tablet Take 1 tablet (40 mg total) by mouth daily. 05/09/15   Eugenie Filler, MD  penicillin v potassium (VEETID) 500 MG tablet Take 1 tablet (500 mg total) by mouth 4 (four) times daily. 06/01/15 06/08/15  Clayton Bibles, PA-C  Vitamin D, Ergocalciferol, (DRISDOL) 50000 units CAPS capsule Take 50,000 Units by mouth every 7 (seven) days.    Historical Provider, MD   BP 121/78 mmHg  Pulse 78  Temp(Src) 98.2 F (36.8 C) (Oral)  Resp 20  Ht 5\' 5"  (1.651 m)  Wt 72.576 kg  BMI 26.63 kg/m2  SpO2 100%  LMP 05/07/2015 Physical Exam  Constitutional: She appears well-developed and well-nourished. No distress.  HENT:  Head: Normocephalic and atraumatic.  Mouth/Throat: Uvula is midline and oropharynx is clear and moist. Mucous membranes are not dry. No uvula swelling. No oropharyngeal exudate, posterior oropharyngeal edema, posterior oropharyngeal erythema or tonsillar abscesses.  Left lower molars with deep decay and abscess, adjacent left sided facial swelling.    Neck: Trachea normal, normal range of motion and phonation normal. Neck supple. No tracheal tenderness present. No rigidity. No tracheal  deviation, no edema, no erythema and normal range of motion present.  Cardiovascular: Normal rate.   Pulmonary/Chest: Effort normal and breath sounds normal. No stridor.  Lymphadenopathy:    She has no cervical adenopathy.  Neurological: She is alert.  Skin: She is not diaphoretic.  Nursing note and vitals reviewed.   ED Course  Procedures (including critical care time) Labs Review Labs Reviewed - No data to display  Imaging Review No results found. I have personally reviewed and evaluated these images and lab results as part of my medical decision-making.   EKG Interpretation None      MDM   Final diagnoses:  Dental abscess    Afebrile, nontoxic patient with new dental pain and obvious abscess.  No airway concerns. No e/o Ludwig's angina.  D/C home with antibiotic, pain medication and dental follow up.  Discussed findings, treatment, and follow up  with patient.  Pt given return precautions.  Pt verbalizes understanding and agrees with plan.         Clayton Bibles, PA-C 06/01/15 [place], MD 06/01/15 1103

## 2015-06-01 NOTE — ED Notes (Signed)
Pt noticed edema to left lower molar yesterday at work, pt attempted to get into dentist but was unable to get appointment, now with throbbing pain to left lower molar

## 2015-06-01 NOTE — Discharge Instructions (Signed)
Read the information below.  Use the prescribed medication as directed.  Please discuss all new medications with your pharmacist.  You may return to the Emergency Department at any time for worsening condition or any new symptoms that concern you.    Please call the dentist listed above within 48 hours to schedule a close follow up appointment or follow up with the dentist with whom you have already scheduled.  If you develop fevers, swelling in your face, difficulty swallowing or breathing, return to the ER immediately for a recheck.     Dental Abscess A dental abscess is a collection of pus in or around a tooth. CAUSES This condition is caused by a bacterial infection around the root of the tooth that involves the inner part of the tooth (pulp). It may result from:  Severe tooth decay.  Trauma to the tooth that allows bacteria to enter into the pulp, such as a broken or chipped tooth.  Severe gum disease around a tooth. SYMPTOMS Symptoms of this condition include:  Severe pain in and around the infected tooth.  Swelling and redness around the infected tooth, in the mouth, or in the face.  Tenderness.  Pus drainage.  Bad breath.  Bitter taste in the mouth.  Difficulty swallowing.  Difficulty opening the mouth.  Nausea.  Vomiting.  Chills.  Swollen neck glands.  Fever. DIAGNOSIS This condition is diagnosed with examination of the infected tooth. During the exam, your dentist may tap on the infected tooth. Your dentist will also ask about your medical and dental history and may order X-rays. TREATMENT This condition is treated by eliminating the infection. This may be done with:  Antibiotic medicine.  A root canal. This may be performed to save the tooth.  Pulling (extracting) the tooth. This may also involve draining the abscess. This is done if the tooth cannot be saved. HOME CARE INSTRUCTIONS  Take medicines only as directed by your dentist.  If you were  prescribed antibiotic medicine, finish all of it even if you start to feel better.  Rinse your mouth (gargle) often with salt water to relieve pain or swelling.  Do not drive or operate heavy machinery while taking pain medicine.  Do not apply heat to the outside of your mouth.  Keep all follow-up visits as directed by your dentist. This is important. SEEK MEDICAL CARE IF:  Your pain is worse and is not helped by medicine. SEEK IMMEDIATE MEDICAL CARE IF:  You have a fever or chills.  Your symptoms suddenly get worse.  You have a very bad headache.  You have problems breathing or swallowing.  You have trouble opening your mouth.  You have swelling in your neck or around your eye.   This information is not intended to replace advice given to you by your health care provider. Make sure you discuss any questions you have with your health care provider.   Document Released: 12/23/2004 Document Revised: 05/09/2014 Document Reviewed: 12/20/2013 Elsevier Interactive Patient Education [employer].

## 2015-06-07 ENCOUNTER — Other Ambulatory Visit: Payer: Self-pay | Admitting: Family Medicine

## 2015-06-07 DIAGNOSIS — Z1231 Encounter for screening mammogram for malignant neoplasm of breast: Secondary | ICD-10-CM

## 2015-07-05 ENCOUNTER — Ambulatory Visit: Payer: BLUE CROSS/BLUE SHIELD

## 2015-07-06 ENCOUNTER — Ambulatory Visit
Admission: RE | Admit: 2015-07-06 | Discharge: 2015-07-06 | Disposition: A | Discharge status: Home / Self Care | Payer: BLUE CROSS/BLUE SHIELD | Source: Ambulatory Visit | Attending: Family Medicine | Admitting: Family Medicine

## 2015-07-06 DIAGNOSIS — Z1231 Encounter for screening mammogram for malignant neoplasm of breast: Secondary | ICD-10-CM

## 2016-05-14 ENCOUNTER — Inpatient Hospital Stay (HOSPITAL_COMMUNITY): Payer: BLUE CROSS/BLUE SHIELD

## 2016-05-14 ENCOUNTER — Encounter (HOSPITAL_COMMUNITY): Payer: Self-pay | Admitting: *Deleted

## 2016-05-14 ENCOUNTER — Observation Stay (HOSPITAL_COMMUNITY)
Admission: EM | Admit: 2016-05-14 | Discharge: 2016-05-15 | Disposition: A | Discharge status: Home / Self Care | Payer: BLUE CROSS/BLUE SHIELD | Attending: Student in an Organized Health Care Education/Training Program | Admitting: Student in an Organized Health Care Education/Training Program

## 2016-05-14 DIAGNOSIS — Z91013 Allergy to seafood: Secondary | ICD-10-CM | POA: Diagnosis not present

## 2016-05-14 DIAGNOSIS — D500 Iron deficiency anemia secondary to blood loss (chronic): Secondary | ICD-10-CM | POA: Diagnosis not present

## 2016-05-14 DIAGNOSIS — N939 Abnormal uterine and vaginal bleeding, unspecified: Secondary | ICD-10-CM

## 2016-05-14 DIAGNOSIS — Z8249 Family history of ischemic heart disease and other diseases of the circulatory system: Secondary | ICD-10-CM | POA: Diagnosis not present

## 2016-05-14 DIAGNOSIS — D649 Anemia, unspecified: Secondary | ICD-10-CM | POA: Diagnosis present

## 2016-05-14 DIAGNOSIS — Z881 Allergy status to other antibiotic agents status: Secondary | ICD-10-CM | POA: Diagnosis not present

## 2016-05-14 DIAGNOSIS — N920 Excessive and frequent menstruation with regular cycle: Principal | ICD-10-CM | POA: Diagnosis present

## 2016-05-14 DIAGNOSIS — Z832 Family history of diseases of the blood and blood-forming organs and certain disorders involving the immune mechanism: Secondary | ICD-10-CM

## 2016-05-14 DIAGNOSIS — Z823 Family history of stroke: Secondary | ICD-10-CM

## 2016-05-14 DIAGNOSIS — D62 Acute posthemorrhagic anemia: Secondary | ICD-10-CM

## 2016-05-14 HISTORY — DX: Anemia, unspecified: D64.9

## 2016-05-14 LAB — CBC
HCT: 18.3 % — ABNORMAL LOW (ref 36.0–46.0)
HEMOGLOBIN: 5 g/dL — AB (ref 12.0–15.0)
MCH: 18.7 pg — ABNORMAL LOW (ref 26.0–34.0)
MCHC: 27.3 g/dL — AB (ref 30.0–36.0)
MCV: 68.5 fL — ABNORMAL LOW (ref 78.0–100.0)
Platelets: 149 10*3/uL — ABNORMAL LOW (ref 150–400)
RBC: 2.67 MIL/uL — ABNORMAL LOW (ref 3.87–5.11)
RDW: 17.9 % — AB (ref 11.5–15.5)
WBC: 3.9 10*3/uL — AB (ref 4.0–10.5)

## 2016-05-14 LAB — COMPREHENSIVE METABOLIC PANEL
ALBUMIN: 3.8 g/dL (ref 3.5–5.0)
ALK PHOS: 33 U/L — AB (ref 38–126)
ALT: 11 U/L — ABNORMAL LOW (ref 14–54)
ANION GAP: 7 (ref 5–15)
AST: 19 U/L (ref 15–41)
BILIRUBIN TOTAL: 0.5 mg/dL (ref 0.3–1.2)
BUN: 7 mg/dL (ref 6–20)
CALCIUM: 8.8 mg/dL — AB (ref 8.9–10.3)
CO2: 23 mmol/L (ref 22–32)
Chloride: 108 mmol/L (ref 101–111)
Creatinine, Ser: 0.6 mg/dL (ref 0.44–1.00)
GLUCOSE: 88 mg/dL (ref 65–99)
Potassium: 3.4 mmol/L — ABNORMAL LOW (ref 3.5–5.1)
Sodium: 138 mmol/L (ref 135–145)
TOTAL PROTEIN: 6.6 g/dL (ref 6.5–8.1)

## 2016-05-14 LAB — PREPARE RBC (CROSSMATCH)

## 2016-05-14 MED ORDER — ACETAMINOPHEN 650 MG RE SUPP: 650.0000 mg | Freq: Four times a day (QID) | RECTAL | Status: DC | PRN

## 2016-05-14 MED ORDER — FOLIC ACID 1 MG PO TABS
1.0000 mg | ORAL_TABLET | Freq: Every day | ORAL | Status: DC
  Administered 2016-05-14 – 2016-05-15 (×2): 1 mg via ORAL
  Filled 2016-05-14 (×2): qty 1

## 2016-05-14 MED ORDER — SODIUM CHLORIDE 0.9 % IV SOLN
Freq: Once | INTRAVENOUS | Status: AC
  Administered 2016-05-14: 17:00:00 via INTRAVENOUS

## 2016-05-14 MED ORDER — ONDANSETRON HCL 4 MG PO TABS: 4.0000 mg | ORAL_TABLET | Freq: Four times a day (QID) | ORAL | Status: DC | PRN

## 2016-05-14 MED ORDER — TRAMADOL HCL 50 MG PO TABS: 50.0000 mg | ORAL_TABLET | Freq: Four times a day (QID) | ORAL | Status: DC | PRN

## 2016-05-14 MED ORDER — ONDANSETRON HCL 4 MG/2ML IJ SOLN
4.0000 mg | Freq: Four times a day (QID) | INTRAMUSCULAR | Status: DC | PRN
Start: 2016-05-14 — End: 2016-05-15

## 2016-05-14 MED ORDER — SODIUM CHLORIDE 0.9% FLUSH
3.0000 mL | Freq: Two times a day (BID) | INTRAVENOUS | Status: DC
  Administered 2016-05-15: 3 mL via INTRAVENOUS

## 2016-05-14 MED ORDER — ADULT MULTIVITAMIN W/MINERALS CH
1.0000 | ORAL_TABLET | Freq: Every day | ORAL | Status: DC
  Administered 2016-05-14 – 2016-05-15 (×2): 1 via ORAL
  Filled 2016-05-14 (×2): qty 1

## 2016-05-14 MED ORDER — ACETAMINOPHEN 325 MG PO TABS
650.0000 mg | ORAL_TABLET | Freq: Four times a day (QID) | ORAL | Status: DC | PRN
  Administered 2016-05-14 – 2016-05-15 (×2): 650 mg via ORAL
  Filled 2016-05-14 (×2): qty 2

## 2016-05-14 MED ORDER — FERROUS SULFATE 325 (65 FE) MG PO TABS
325.0000 mg | ORAL_TABLET | Freq: Every day | ORAL | Status: DC
  Administered 2016-05-15: 325 mg via ORAL
  Filled 2016-05-14: qty 1

## 2016-05-14 NOTE — H&P (Signed)
Date: 05/14/2016               Patient Name:  Suzanne Aguirre MRN: [id number]  DOB: [birth year] Age / Sex: 46 y.o., female   PCP: Glendon Axe, MD         Medical Service: Internal Medicine Teaching Service         Attending Physician: Dr. Evette Doffing, Mallie Mussel, *    First Contact: Dr. Jari Favre Pager: [phone]  Second Contact: Dr. Benjamine Mola Pager: [phone]       After Hours (After 5p/  First Contact Pager: [phone]  weekends / holidays): Second Contact Pager: [phone]   Chief Complaint: headaches, weakness  History of Present Illness:  Ms. Dunnigan is a 46yo female with PMH of menorrhagia presenting to [facility] at request of her PCP. Patient saw PCP on 5/7 and had labwork done that has resulted today; she had a Hgb of 5.4. Patient has been having generalized headaches, weakness, fatigue, dizziness and chills for about a week that are correlated to starting her period. Patient states she has been having a heavy period since 4/29. She states her periods usually last about 6-7 days and uses about 6 pads per day. This period is only different in that it has been even more prolonged. She was hospitalized last year in May for same and required transfusions. She was discharged with plan to follow up with OB/Gyn for either pharmacological or surgical treatment as well as on iron supplementation. Patient states that she had not followed up and has not taken iron.   She states she has never had pelvic ultrasound or formal workup for her menorrhagia. Other than above symptoms, she denies chest pain or shortness of breath, urinary symptoms, fevers, nausea, vomiting, diarrhea; she denies easy bruising or bleeding. She denies family history of bleeding disorders.   Meds:  OTC ibuprofen   Allergies: Allergies as of 05/14/2016 - Review Complete 05/14/2016  Allergen Reaction Noted  . Fish allergy Anaphylaxis 05/08/2015  . Doxycycline Hives 07/17/2010   Past Medical History:  Diagnosis Date  .  Abnormal Pap smear   . Anemia   . Hypertension   . No pertinent past medical history     Family History: Family history of carotid disease; denies family history of MI's, CVA's, bleeding disorders or VTEs.   Social History: Patient denies tobacco, illicit drug, or significant alcohol use.   Review of Systems: A complete ROS was negative except as per HPI.   Physical Exam: Blood pressure 139/75, pulse 77, temperature 98.5 F (36.9 C), temperature source Oral, resp. rate 12, height 5\' 5"  (1.651 m), weight 71.7 kg (158 lb), last menstrual period 05/04/2016, SpO2 100 %, currently breastfeeding. General: alert, well-developed, and cooperative to examination.  Head: normocephalic and atraumatic.  Eyes: vision grossly intact, pupils equal, pupils round, pupils reactive to light. Mild scleral and conjunctival icterus.  Mouth: pharynx pink and moist, no erythema, and no exudates.  Neck: supple, full ROM, no thyromegaly.  Lungs: normal respiratory effort, no accessory muscle use, normal breath sounds, no crackles, and no wheezes. Heart: normal rate, regular rhythm, systolic ejection murmur best heard at LUSB, no gallop, and no rub. >3sec capillary refill time. Abdomen: soft, non-tender, normal bowel sounds, no distention, no guarding, no rebound tenderness, no hepatomegaly, and no splenomegaly, no masses.  Msk: no joint swelling, no joint warmth, and no redness over joints.  Pulses: 2+ DP/PT pulses bilaterally Extremities: No cyanosis, clubbing, edema Neurologic: alert & oriented X3, cranial  nerves II-XII intact, strength normal in all extremities, sensation intact to light touch, and gait normal.  Skin: turgor normal; small hyperpigmented plaques on dorsal aspects of hands.  Psych: Oriented X3, memory intact for recent and remote, normally interactive, good eye contact, not anxious appearing, and not depressed appearing  LABS: WBC 3.9, Hgb 5.0, Hct 18.3, Plts 149 Na 138, K 3.4, Cl 108, CO2  23, BUN 7, Cr 0.6, Glu 88  EKG: Pending Assessment & Plan by Problem: Active Problems:   Symptomatic anemia  Symptomatic anemia 2/2 blood loss: Patient with menorrhagia that has not been completely worked up in the past, presents with symptomatic anemia and Hgb of 5.0. She has had a previous admission last year for same and received transfusions but failed to follow up with OB/Gyn and take iron supplementation.  --type and cross --transfuse 3 units; f/u post transfusion H/H; goal hgb >7 --f/u EKG --f/u urine HCG, PT/INR, ferritin, LDH --f/u AM CBC, Bmet --pelvic U/S --if vaginal bleeding continues, consider inpatient Gyn consult  Diet: regular VTE ppx: SCDs Code: FULL  Dispo: Admit patient to Inpatient with expected length of stay greater than 2 midnights.  Signed: Alphonzo Grieve, MD 05/14/2016, 4:12 PM  Pager [phone]

## 2016-05-14 NOTE — Progress Notes (Signed)
Suzanne Aguirre is a 46 y.o. female patient admitted from ED awake, alert - oriented  X 4 - no acute distress noted.  VSS - Blood pressure 127/81, pulse 82, temperature 98.7 F (37.1 C), temperature source Oral, resp. rate 15, height 5\' 5"  (1.651 m), weight 71.7 kg (158 lb), last menstrual period 05/04/2016, SpO2 100 %, currently breastfeeding.    IV in place, occlusive dsg intact without redness.  Orientation to room, and floor completed with information packet given to patient/family.  Patient declined safety video at this time.  Admission INP armband ID verified with patient/family, and in place.   SR up x 2, fall assessment complete, with patient and family able to verbalize understanding of risk associated with falls, and verbalized understanding to call nsg before up out of bed.  Call light within reach, patient able to voice, and demonstrate understanding.  Skin, clean-dry- intact without evidence of bruising, or skin tears.   No evidence of skin break down noted on exam.     Will cont to eval and treat per MD orders.  Luci Bank, RN 05/14/2016 6:38 PM

## 2016-05-14 NOTE — ED Notes (Signed)
Pt placed on cardiac monitor appears in no distress family at bedside

## 2016-05-14 NOTE — ED Provider Notes (Signed)
[facility] DEPT Provider Note   CSN: [id number] Arrival date & time: 05/14/16  1336     History   Chief Complaint Chief Complaint  Patient presents with  . Abnormal Lab    HPI Suzanne Aguirre is a 46 y.o. female.  HPI Patient was sent in for anemia. Has been short of breath fatigued and having headaches. She has history of anemia due to heavy menses. States that she has had her menses for last 11 days. States is heavier and longer than normal for her. Around 1 years ago she did have similar symptoms and require transfusion. Has had GYN evaluation but is unsure and neck stat. Talkative ablation versus hysterectomy.   Past Medical History:  Diagnosis Date  . Abnormal Pap smear   . Anemia   . Hypertension   . No pertinent past medical history     Patient Active Problem List   Diagnosis Date Noted  . Symptomatic anemia 05/08/2015  . Chest pressure 05/08/2015  . Hypertension 05/08/2015  . Hypermenorrhea 05/08/2015    Past Surgical History:  Procedure Laterality Date  . COLPOSCOPY    . INTRAUTERINE DEVICE INSERTION    . TUBAL LIGATION      OB History    Gravida Para Term Preterm AB Living   4 4 4     4    SAB TAB Ectopic Multiple Live Births           4       Home Medications    Prior to Admission medications   Medication Sig Start Date End Date Taking? Authorizing Provider  ibuprofen (ADVIL,MOTRIN) 200 MG tablet Take 200 mg by mouth every 6 (six) hours as needed.   Yes [provider]    Family History Family History  Problem Relation Age of Onset  . Hypertension Mother   . Hypertension Father   . Diabetes Mellitus II Father   . Prostate cancer Father   . Hypertension Paternal Grandmother   . Hypertension Paternal Grandfather     Social History Social History  Substance Use Topics  . Smoking status: Never Smoker  . Smokeless tobacco: Not on file  . Alcohol use No     Allergies   Fish allergy; Doxycycline; and  Meclizine   Review of Systems Review of Systems  Constitutional: Positive for fatigue. Negative for appetite change and fever.  HENT: Negative for congestion.   Respiratory: Positive for shortness of breath.   Cardiovascular: Negative for chest pain.  Gastrointestinal: Negative for anal bleeding.  Genitourinary: Positive for vaginal bleeding.  Musculoskeletal: Negative for back pain.  Skin: Negative for rash.  Neurological: Positive for headaches.  Hematological: Negative for adenopathy.  Psychiatric/Behavioral: Negative for confusion.     Physical Exam Updated Vital Signs BP 131/82   Pulse 82   Temp 98.3 F (36.8 C) (Oral)   Resp 20   Ht 5\' 5"  (1.651 m)   Wt 158 lb (71.7 kg)   LMP 05/04/2016   SpO2 100%   BMI 26.29 kg/m   Physical Exam  Constitutional: She appears well-developed.  HENT:  Head: Normocephalic.  Eyes: EOM are normal.  Neck: Neck supple.  Cardiovascular: Normal rate.   Pulmonary/Chest: Effort normal.  Abdominal: Soft. There is no tenderness.  Musculoskeletal: She exhibits no edema.  Neurological: She is alert.  Skin: Capillary refill takes less than 2 seconds. There is pallor.     ED Treatments / Results  Labs (all labs ordered are listed, but only abnormal results  are displayed) Labs Reviewed  COMPREHENSIVE METABOLIC PANEL - Abnormal; Notable for the following:       Result Value   Potassium 3.4 (*)    Calcium 8.8 (*)    ALT 11 (*)    Alkaline Phosphatase 33 (*)    All other components within normal limits  CBC - Abnormal; Notable for the following:    WBC 3.9 (*)    RBC 2.67 (*)    Hemoglobin 5.0 (*)    HCT 18.3 (*)    MCV 68.5 (*)    MCH 18.7 (*)    MCHC 27.3 (*)    RDW 17.9 (*)    Platelets 149 (*)    All other components within normal limits  PREGNANCY, URINE  HIV ANTIBODY (ROUTINE TESTING)  RETICULOCYTES  PROTIME-INR  FERRITIN  TYPE AND SCREEN  PREPARE RBC (CROSSMATCH)    EKG  EKG Interpretation None        Radiology No results found.  Procedures Procedures (including critical care time)  Medications Ordered in ED Medications  sodium chloride flush (NS) 0.9 % injection 3 mL (not administered)  ondansetron (ZOFRAN) tablet 4 mg (not administered)    Or  ondansetron (ZOFRAN) injection 4 mg (not administered)  acetaminophen (TYLENOL) tablet 650 mg (not administered)    Or  acetaminophen (TYLENOL) suppository 650 mg (not administered)  traMADol (ULTRAM) tablet 50 mg (not administered)  folic acid (FOLVITE) tablet 1 mg (1 mg Oral Given 05/14/16 1622)  multivitamin with minerals tablet 1 tablet (1 tablet Oral Given 05/14/16 1622)  0.9 %  sodium chloride infusion ( Intravenous New Bag/Given 05/14/16 1642)     Initial Impression / Assessment and Plan / ED Course  I have reviewed the triage vital signs and the nursing notes.  Pertinent labs & imaging results that were available during my care of the patient were reviewed by me and considered in my medical decision making (see chart for details).     Patient with anemia. Likely from her heavy periods. Has had history of same. Symptomatic and that she is lightheaded and feels weak. Hemoglobin of 5. Admit to internal medicine.  Final Clinical Impressions(s) / ED Diagnoses   Final diagnoses:  Symptomatic anemia  Menorrhagia with regular cycle    New Prescriptions New Prescriptions   No medications on file     Davonna Belling, MD 05/14/16 1643

## 2016-05-14 NOTE — ED Notes (Signed)
Attempted to give report RN to call back

## 2016-05-14 NOTE — ED Triage Notes (Signed)
Pt reports having checkup at work, was sent to pcp due to hgb 5.4. Sent here for blood transfusion. Had similar episode one year ago.

## 2016-05-14 NOTE — ED Notes (Addendum)
Date and time results received: 05/14/16 1429  Test: Hemoglobin Critical Value: 5.0  Name of Provider Notified: Dr Tanna Furry  Orders Received? Or Actions Taken?: no additional orders at this time

## 2016-05-15 DIAGNOSIS — D62 Acute posthemorrhagic anemia: Secondary | ICD-10-CM | POA: Diagnosis not present

## 2016-05-15 DIAGNOSIS — N920 Excessive and frequent menstruation with regular cycle: Secondary | ICD-10-CM | POA: Diagnosis not present

## 2016-05-15 DIAGNOSIS — Z91013 Allergy to seafood: Secondary | ICD-10-CM | POA: Diagnosis not present

## 2016-05-15 DIAGNOSIS — Z881 Allergy status to other antibiotic agents status: Secondary | ICD-10-CM | POA: Diagnosis not present

## 2016-05-15 LAB — BPAM RBC
BLOOD PRODUCT EXPIRATION DATE: 201805282359
BLOOD PRODUCT EXPIRATION DATE: 201805282359
Blood Product Expiration Date: 201805282359
ISSUE DATE / TIME: 201805091627
ISSUE DATE / TIME: 201805092054
ISSUE DATE / TIME: 201805092327
UNIT TYPE AND RH: 6200
Unit Type and Rh: 6200
Unit Type and Rh: 6200

## 2016-05-15 LAB — BASIC METABOLIC PANEL
Anion gap: 6 (ref 5–15)
BUN: 8 mg/dL (ref 6–20)
CHLORIDE: 108 mmol/L (ref 101–111)
CO2: 25 mmol/L (ref 22–32)
Calcium: 8.4 mg/dL — ABNORMAL LOW (ref 8.9–10.3)
Creatinine, Ser: 0.66 mg/dL (ref 0.44–1.00)
GFR calc Af Amer: >60 mL/min (ref >60)
GFR calc non Af Amer: >60 mL/min (ref >60)
Glucose, Bld: 90 mg/dL (ref 65–99)
Potassium: 3.8 mmol/L (ref 3.5–5.1)
Sodium: 139 mmol/L (ref 135–145)

## 2016-05-15 LAB — PROTIME-INR
INR: 1.03
Prothrombin Time: 13.5 seconds (ref 11.4–15.2)

## 2016-05-15 LAB — CBC
HEMATOCRIT: 27.4 % — AB (ref 36.0–46.0)
HEMOGLOBIN: 8.2 g/dL — AB (ref 12.0–15.0)
MCH: 22.4 pg — ABNORMAL LOW (ref 26.0–34.0)
MCHC: 29.9 g/dL — ABNORMAL LOW (ref 30.0–36.0)
MCV: 74.9 fL — AB (ref 78.0–100.0)
PLATELETS: 107 10*3/uL — AB (ref 150–400)
RBC: 3.66 MIL/uL — AB (ref 3.87–5.11)
RDW: 20.1 % — ABNORMAL HIGH (ref 11.5–15.5)
WBC: 5.2 10*3/uL (ref 4.0–10.5)

## 2016-05-15 LAB — TYPE AND SCREEN
ABO/RH(D): A POS
ANTIBODY SCREEN: NEGATIVE
UNIT DIVISION: 0
Unit division: 0
Unit division: 0

## 2016-05-15 LAB — FERRITIN: FERRITIN: 6 ng/mL — AB (ref 11–307)

## 2016-05-15 LAB — APTT: APTT: 29 s (ref 24–36)

## 2016-05-15 LAB — PREGNANCY, URINE: PREG TEST UR: NEGATIVE

## 2016-05-15 MED ORDER — ESTROGENS CONJUGATED 1.25 MG PO TABS: 2.5000 mg | ORAL_TABLET | Freq: Two times a day (BID) | ORAL | 0 refills | Status: DC

## 2016-05-15 MED ORDER — POLYSACCHARIDE IRON COMPLEX 150 MG PO CAPS
150.0000 mg | ORAL_CAPSULE | Freq: Every day | ORAL | Status: DC
  Administered 2016-05-15: 150 mg via ORAL
  Filled 2016-05-15: qty 1

## 2016-05-15 MED ORDER — POLYSACCHARIDE IRON COMPLEX 150 MG PO CAPS: 150.0000 mg | ORAL_CAPSULE | Freq: Every day | ORAL | 2 refills | Status: DC

## 2016-05-15 MED ORDER — ESTROGENS CONJUGATED 1.25 MG PO TABS
2.5000 mg | ORAL_TABLET | Freq: Two times a day (BID) | ORAL | Status: DC
  Administered 2016-05-15: 2.5 mg via ORAL
  Filled 2016-05-15 (×2): qty 2

## 2016-05-15 NOTE — Discharge Summary (Signed)
Name: Suzanne Aguirre MRN: [id number] DOB: Feb 04, 1970 46 y.o. PCP: Glendon Axe, MD  Date of Admission: 05/14/2016  3:00 PM Date of Discharge: 05/15/2016 Attending Physician: Axel Filler, *  Discharge Diagnosis: 1. Symptomatic anemia 2. Menorrhagia Principal Problem:   Symptomatic anemia Active Problems:   Hypermenorrhea   Discharge Medications: Allergies as of 05/15/2016      Reactions   Fish Allergy Anaphylaxis   Doxycycline Hives   Meclizine Nausea And Vomiting      Medication List    STOP taking these medications   ibuprofen 200 MG tablet Commonly known as:  ADVIL,MOTRIN     TAKE these medications   estrogens (conjugated) 1.25 MG tablet Commonly known as:  PREMARIN Take 2 tablets (2.5 mg total) by mouth 2 (two) times daily.   iron polysaccharides 150 MG capsule Commonly known as:  NIFEREX Take 1 capsule (150 mg total) by mouth daily. Start taking on:  05/16/2016       Disposition and follow-up:   Ms.Suzanne Aguirre was discharged from [facility] in Stable condition.  At the hospital follow up visit please address:  1.   Menorrhagia: --Premarin 2.5mg  BID x7 days --Recommend starting medroxyprogestin 10mg  daily after Premarin course completed; will need prescription  Symptomatic anemia: --please repeat CBC --PT/INR and PTT were normal in hospital --Ferritin 6 --started on Nu-Iron 150mg  daily  2.  Labs / imaging needed at time of follow-up: CBC  3.  Pending labs/ test needing follow-up: none  Follow-up Appointments: Follow-up Information    Ob/Gyn, [place]. Go on 05/21/2016.   Specialty:  Obstetrics and Gynecology Why:  at 9:15am Contact information: [address] [place]  [address] [phone]        Glendon Axe, MD. Schedule an appointment as soon as possible for a visit in 1 week(s).   Specialty:  Family Medicine Contact information: [address] [place]  [address] [place] [facility] Course by problem list: Principal Problem:   Symptomatic anemia Active Problems:   Hypermenorrhea   Symptomatic anemia: Patient presented with about a week long history of weakness, fatigue, chills, and headache; labs obtained at work showed Hgb of 5.4. On admission, her vital signs were stable; she was transfused 3 units of pRBCs with appropriate increase in her Hgb. She was symptomatically improved and vaginal bleeding was starting to lighten. Ferritin was found to be low at 6; coagulation studies were unremarkable. Patient was started on iron replacement therapy (Nu-iron). Please repeat her CBC at follow up appointment to ensure anemia is not worsening.   Menorrhagia: Patient with long history of menorrhagia that has not previously been worked up. She presented with a prolonged menstrual period leading to symptomatic anemia as discussed above. Patient had a previous admission last year for same and required blood transfusions at that time; she had not followed up with OB/Gyn yet for further evaluation and management. Day after admission, bleeding was getting lighter. Patient was started on Premarin 2.5mg  BID to help stop the bleeding; she was given a 7 day course. An appointment with OB/Gyn was made for one week following discharge for transition from Premarin to medroxyprogesterone 10mg  daily vs other therapy as seen appropriate by provider. Pelvic and transvaginal ultrasounds were performed and did not show any obvious fibroids.   Discharge Vitals:   BP 130/71 (BP Location: Right Arm)   Pulse 88   Temp 98 F (36.7 C) (  Oral)   Resp 17   Ht 5\' 5"  (1.651 m)   Wt 71.7 kg (158 lb)   LMP 05/04/2016   SpO2 100%   BMI 26.29 kg/m   Pertinent Labs, Studies, and Procedures:  CBC Latest Ref Rng & Units 05/15/2016 05/14/2016 05/09/2015  WBC 4.0 - 10.5 K/uL 5.2 3.9(L) 8.0  Hemoglobin 12.0 - 15.0 g/dL 8.2(L) 5.0(LL) 10.1(L)  Hematocrit 36.0 - 46.0 %  27.4(L) 18.3(L) 33.1(L)  Platelets 150 - 400 K/uL 107(L) 149(L) 182   BMET    Component Value Date/Time   NA 139 05/15/2016 0409   K 3.8 05/15/2016 0409   CL 108 05/15/2016 0409   CO2 25 05/15/2016 0409   GLUCOSE 90 05/15/2016 0409   BUN 8 05/15/2016 0409   CREATININE 0.66 05/15/2016 0409   CALCIUM 8.4 (L) 05/15/2016 0409   GFRNONAA >60 05/15/2016 0409   GFRAA >60 05/15/2016 0409   Urine pregnancy 05/15/16: negative PT/INR 05/15/16: 13.5/1.03 PTT 05/15/16: 29 Ferritin 05/15/16: 6  U/S pelvis complete, transvaginal 05/14/2016: 1. Retroverted uterus with normal endometrial thickness. Myometrial endometrial junction was indistinct, however endometrial thickness measured approximately 11 mm. If bleeding remains unresponsive to hormonal or medical therapy, sonohysterogram should be considered for focal lesion work-up. (Ref: Radiological Reasoning: Algorithmic Workup of Abnormal Vaginal Bleeding with Endovaginal Sonography and Sonohysterography. AJR 2008; [id number]:[id number]) 2. Nonvisualization of the ovaries.  No adnexal mass  Discharge Instructions: Discharge Instructions    Call MD for:  difficulty breathing, headache or visual disturbances    Complete by:  As directed    Call MD for:  extreme fatigue    Complete by:  As directed    Call MD for:  hives    Complete by:  As directed    Call MD for:  persistant dizziness or light-headedness    Complete by:  As directed    Call MD for:  persistant nausea and vomiting    Complete by:  As directed    Call MD for:  redness, tenderness, or signs of infection (pain, swelling, redness, odor or green/yellow discharge around incision site)    Complete by:  As directed    Call MD for:  severe uncontrolled pain    Complete by:  As directed    Call MD for:  temperature >100.4    Complete by:  As directed    Diet - low sodium heart healthy    Complete by:  As directed    Discharge instructions    Complete by:  As directed    Please go to [facility] on 5/16 at 9:15 AM (please bring insurance card, picture ID, and this paperwork). You should have your blood levels checked at that time.  Please take Premarin 2.5mg  twice a day. You have been given a one week prescription (fill it today). You will be seen by OB/Gyn before this runs out and they will start you on different medicine at that point.   If you start feeling weaker, more fatigued, more dizzy, have any chest pain, shortness of breath and your bleeding does not slow down, please either see your PCP or go to the ED.   Increase activity slowly    Complete by:  As directed       Signed: Alphonzo Grieve, MD 05/15/2016, 3:25 PM   Pager [phone]

## 2016-05-15 NOTE — Progress Notes (Signed)
   Subjective:  Patient states she still feels tired but thinks it is due to lack of sleep last night. She overall states she feels better. She denies chest pain or shortness of breath; she states she thinks her bleeding is getting lighter today.   We discussed treatment options and need for close follow up with at least PCP for management of her menorrhagia if not also OB/Gyn.  Objective:  Vital signs in last 24 hours: Vitals:   05/14/16 2319 05/14/16 2354 05/15/16 0159 05/15/16 0625  BP: (!) 104/47 110/60 104/67 116/70  Pulse: 80 79 75 71  Resp: 18 16 16 18   Temp: 98.5 F (36.9 C) 97.7 F (36.5 C) 98.1 F (36.7 C) 98.3 F (36.8 C)  TempSrc: Oral Oral Oral Oral  SpO2: 100% 100% 100% 100%  Weight:      Height:       Constitutional: NAD, pleasant CV: RRR, no murmurs, rubs or gallops, warm extremities Resp: CTAB, no increased work of breathing Abd: soft, NDNT, +BS  Assessment/Plan:  Principal Problem:   Symptomatic anemia Active Problems:   Hypermenorrhea  Symptomatic anemia 2/2 blood loss: Patient s/p 3 units pRBC's overnight with morning Hg 8.2 which is appropriate and above goal of 7. Patient reports some decrease in bleeding today. Pelvic and transvaginal ultrasounds negative for significant fibroids as cause of menorrhagia.  --Premarin 2.5mg  BID; will continue for about 1 week and switch to medroxyprogesterone acetate 10mg  daily if bleeding has stopped; patient to make appointment with PCP and OB/Gyn who will manage switch. --f/u ferritin and PT/INR - not collected yesterday unfortunately --iron supplementation  Dispo: Anticipated discharge possibly later on today.   Alphonzo Grieve, MD 05/15/2016, 12:08 PM Pager [phone]

## 2016-05-15 NOTE — Progress Notes (Signed)
Suzanne Aguirre to be D/C'd Home per MD order.  Discussed with the patient and all questions fully answered.  VSS, Skin clean, dry and intact without evidence of skin break down, no evidence of skin tears noted. IV catheter discontinued intact. Site without signs and symptoms of complications. Dressing and pressure applied.  An After Visit Summary was printed and given to the patient. Patient received prescription.  D/c education completed with patient/family including follow up instructions, medication list, d/c activities limitations if indicated, with other d/c instructions as indicated by MD - patient able to verbalize understanding, all questions fully answered.   Patient instructed to return to ED, call 911, or call MD for any changes in condition.   Patient escorted via [place], and D/C home via private auto.  Luci Bank 05/15/2016 4:58 PM

## 2016-11-21 ENCOUNTER — Other Ambulatory Visit: Payer: Self-pay | Admitting: Family Medicine

## 2016-11-21 ENCOUNTER — Observation Stay (HOSPITAL_COMMUNITY)
Admission: EM | Admit: 2016-11-21 | Discharge: 2016-11-23 | Disposition: A | Discharge status: Home / Self Care | Payer: BLUE CROSS/BLUE SHIELD | Attending: Internal Medicine | Admitting: Internal Medicine

## 2016-11-21 ENCOUNTER — Encounter (HOSPITAL_COMMUNITY): Payer: Self-pay

## 2016-11-21 ENCOUNTER — Other Ambulatory Visit: Payer: Self-pay

## 2016-11-21 DIAGNOSIS — D62 Acute posthemorrhagic anemia: Principal | ICD-10-CM | POA: Diagnosis present

## 2016-11-21 DIAGNOSIS — I10 Essential (primary) hypertension: Secondary | ICD-10-CM | POA: Diagnosis present

## 2016-11-21 DIAGNOSIS — D509 Iron deficiency anemia, unspecified: Secondary | ICD-10-CM | POA: Diagnosis not present

## 2016-11-21 DIAGNOSIS — Z79899 Other long term (current) drug therapy: Secondary | ICD-10-CM | POA: Diagnosis not present

## 2016-11-21 DIAGNOSIS — N938 Other specified abnormal uterine and vaginal bleeding: Secondary | ICD-10-CM | POA: Insufficient documentation

## 2016-11-21 DIAGNOSIS — Z1231 Encounter for screening mammogram for malignant neoplasm of breast: Secondary | ICD-10-CM

## 2016-11-21 DIAGNOSIS — D649 Anemia, unspecified: Secondary | ICD-10-CM

## 2016-11-21 DIAGNOSIS — N920 Excessive and frequent menstruation with regular cycle: Secondary | ICD-10-CM | POA: Diagnosis not present

## 2016-11-21 DIAGNOSIS — N854 Malposition of uterus: Secondary | ICD-10-CM | POA: Diagnosis not present

## 2016-11-21 NOTE — ED Triage Notes (Signed)
Pt states that she received a call from her doctor and told to come here for a hbg of 5.5, hx of the same with transfusion. C/o of fatigue, denies SOB

## 2016-11-22 ENCOUNTER — Other Ambulatory Visit: Payer: Self-pay

## 2016-11-22 ENCOUNTER — Encounter (HOSPITAL_COMMUNITY): Payer: Self-pay | Admitting: *Deleted

## 2016-11-22 DIAGNOSIS — D649 Anemia, unspecified: Secondary | ICD-10-CM | POA: Diagnosis not present

## 2016-11-22 DIAGNOSIS — D62 Acute posthemorrhagic anemia: Secondary | ICD-10-CM | POA: Diagnosis present

## 2016-11-22 LAB — CBC
HCT: 20.2 % — ABNORMAL LOW (ref 36.0–46.0)
HEMATOCRIT: 30.3 % — AB (ref 36.0–46.0)
HEMATOCRIT: 29.6 % — AB (ref 36.0–46.0)
HEMOGLOBIN: 9.2 g/dL — AB (ref 12.0–15.0)
Hemoglobin: 5.8 g/dL — CL (ref 12.0–15.0)
Hemoglobin: 9 g/dL — ABNORMAL LOW (ref 12.0–15.0)
MCH: 20.4 pg — AB (ref 26.0–34.0)
MCH: 22.8 pg — AB (ref 26.0–34.0)
MCH: 23 pg — AB (ref 26.0–34.0)
MCHC: 28.7 g/dL — AB (ref 30.0–36.0)
MCHC: 30.4 g/dL (ref 30.0–36.0)
MCHC: 30.4 g/dL (ref 30.0–36.0)
MCV: 70.9 fL — AB (ref 78.0–100.0)
MCV: 75.1 fL — AB (ref 78.0–100.0)
MCV: 75.8 fL — AB (ref 78.0–100.0)
PLATELETS: 221 10*3/uL (ref 150–400)
PLATELETS: 262 10*3/uL (ref 150–400)
Platelets: 197 10*3/uL (ref 150–400)
RBC: 2.85 MIL/uL — ABNORMAL LOW (ref 3.87–5.11)
RBC: 3.94 MIL/uL (ref 3.87–5.11)
RBC: 4 MIL/uL (ref 3.87–5.11)
RDW: 17.7 % — AB (ref 11.5–15.5)
RDW: 17.8 % — ABNORMAL HIGH (ref 11.5–15.5)
RDW: 17.8 % — ABNORMAL HIGH (ref 11.5–15.5)
WBC: 10.7 10*3/uL — ABNORMAL HIGH (ref 4.0–10.5)
WBC: 6.7 10*3/uL (ref 4.0–10.5)
WBC: 9.5 10*3/uL (ref 4.0–10.5)

## 2016-11-22 LAB — COMPREHENSIVE METABOLIC PANEL
ALT: 10 U/L — AB (ref 14–54)
AST: 25 U/L (ref 15–41)
Albumin: 3.8 g/dL (ref 3.5–5.0)
Alkaline Phosphatase: 34 U/L — ABNORMAL LOW (ref 38–126)
Anion gap: 7 (ref 5–15)
BUN: 9 mg/dL (ref 6–20)
CHLORIDE: 106 mmol/L (ref 101–111)
CO2: 23 mmol/L (ref 22–32)
CREATININE: 0.53 mg/dL (ref 0.44–1.00)
Calcium: 9 mg/dL (ref 8.9–10.3)
GFR calc Af Amer: >60 mL/min (ref >60)
GFR calc non Af Amer: >60 mL/min (ref >60)
Glucose, Bld: 108 mg/dL — ABNORMAL HIGH (ref 65–99)
Potassium: 4 mmol/L (ref 3.5–5.1)
SODIUM: 136 mmol/L (ref 135–145)
Total Bilirubin: 0.5 mg/dL (ref 0.3–1.2)
Total Protein: 7 g/dL (ref 6.5–8.1)

## 2016-11-22 LAB — PREPARE RBC (CROSSMATCH)

## 2016-11-22 LAB — IRON AND TIBC
IRON: 9 ug/dL — AB (ref 28–170)
SATURATION RATIOS: 2 % — AB (ref 10.4–31.8)
TIBC: 494 ug/dL — AB (ref 250–450)
UIBC: 485 ug/dL

## 2016-11-22 LAB — RETICULOCYTES
RBC.: 2.88 MIL/uL — ABNORMAL LOW (ref 3.87–5.11)
RETIC CT PCT: 1.8 % (ref 0.4–3.1)
Retic Count, Absolute: 51.8 10*3/uL (ref 19.0–186.0)

## 2016-11-22 LAB — BASIC METABOLIC PANEL
Anion gap: 4 — ABNORMAL LOW (ref 5–15)
BUN: 10 mg/dL (ref 6–20)
CHLORIDE: 106 mmol/L (ref 101–111)
CO2: 28 mmol/L (ref 22–32)
CREATININE: 0.77 mg/dL (ref 0.44–1.00)
Calcium: 8.8 mg/dL — ABNORMAL LOW (ref 8.9–10.3)
GFR calc Af Amer: >60 mL/min (ref >60)
GFR calc non Af Amer: >60 mL/min (ref >60)
Glucose, Bld: 96 mg/dL (ref 65–99)
Potassium: 3.6 mmol/L (ref 3.5–5.1)
Sodium: 138 mmol/L (ref 135–145)

## 2016-11-22 LAB — FOLATE: Folate: 13.1 ng/mL (ref >5.9)

## 2016-11-22 LAB — HCG, QUANTITATIVE, PREGNANCY: HCG, BETA CHAIN, QUANT, S: 2 m[IU]/mL (ref <5)

## 2016-11-22 LAB — VITAMIN B12: VITAMIN B 12: 248 pg/mL (ref 180–914)

## 2016-11-22 LAB — FERRITIN: Ferritin: 1 ng/mL — ABNORMAL LOW (ref 11–307)

## 2016-11-22 MED ORDER — HYDRALAZINE HCL 20 MG/ML IJ SOLN: 5.0000 mg | INTRAMUSCULAR | Status: DC | PRN

## 2016-11-22 MED ORDER — SODIUM CHLORIDE 0.9 % IV BOLUS (SEPSIS)
1000.0000 mL | Freq: Once | INTRAVENOUS | Status: AC
  Administered 2016-11-22: 20 mL via INTRAVENOUS

## 2016-11-22 MED ORDER — ACETAMINOPHEN 650 MG RE SUPP: 650.0000 mg | Freq: Four times a day (QID) | RECTAL | Status: DC | PRN

## 2016-11-22 MED ORDER — ONDANSETRON HCL 4 MG/2ML IJ SOLN
4.0000 mg | Freq: Three times a day (TID) | INTRAMUSCULAR | Status: DC | PRN
  Administered 2016-11-22: 4 mg via INTRAVENOUS
  Filled 2016-11-22: qty 2

## 2016-11-22 MED ORDER — ONDANSETRON HCL 4 MG/2ML IJ SOLN
4.0000 mg | Freq: Four times a day (QID) | INTRAMUSCULAR | Status: DC | PRN
  Administered 2016-11-22: 4 mg via INTRAVENOUS
  Filled 2016-11-22: qty 2

## 2016-11-22 MED ORDER — SODIUM CHLORIDE 0.9 % IV SOLN
Freq: Once | INTRAVENOUS | Status: AC
  Administered 2016-11-22: 12:00:00 via INTRAVENOUS

## 2016-11-22 MED ORDER — MEGESTROL ACETATE 40 MG PO TABS
80.0000 mg | ORAL_TABLET | Freq: Two times a day (BID) | ORAL | Status: DC
  Administered 2016-11-22 – 2016-11-23 (×3): 80 mg via ORAL
  Filled 2016-11-22 (×4): qty 2

## 2016-11-22 MED ORDER — SODIUM CHLORIDE 0.9 % IV SOLN
510.0000 mg | Freq: Once | INTRAVENOUS | Status: AC
  Administered 2016-11-22: 510 mg via INTRAVENOUS
  Filled 2016-11-22: qty 17

## 2016-11-22 MED ORDER — ZOLPIDEM TARTRATE 5 MG PO TABS: 5.0000 mg | ORAL_TABLET | Freq: Every evening | ORAL | Status: DC | PRN

## 2016-11-22 MED ORDER — ACETAMINOPHEN 325 MG PO TABS: 650.0000 mg | ORAL_TABLET | Freq: Four times a day (QID) | ORAL | Status: DC | PRN

## 2016-11-22 MED ORDER — ONDANSETRON HCL 4 MG PO TABS: 4.0000 mg | ORAL_TABLET | Freq: Four times a day (QID) | ORAL | Status: DC | PRN

## 2016-11-22 MED ORDER — SODIUM CHLORIDE 0.9 % IV SOLN
10.0000 mL/h | Freq: Once | INTRAVENOUS | Status: AC
  Administered 2016-11-22: 10 mL/h via INTRAVENOUS

## 2016-11-22 MED ORDER — ACETAMINOPHEN 325 MG PO TABS
650.0000 mg | ORAL_TABLET | Freq: Four times a day (QID) | ORAL | Status: DC | PRN
  Administered 2016-11-22: 650 mg via ORAL
  Filled 2016-11-22: qty 2

## 2016-11-22 MED ORDER — ESTROGENS CONJUGATED 1.25 MG PO TABS: 2.5000 mg | ORAL_TABLET | Freq: Two times a day (BID) | ORAL | Status: DC

## 2016-11-22 MED ORDER — POLYSACCHARIDE IRON COMPLEX 150 MG PO CAPS
150.0000 mg | ORAL_CAPSULE | Freq: Every day | ORAL | Status: DC
  Administered 2016-11-22 – 2016-11-23 (×2): 150 mg via ORAL
  Filled 2016-11-22 (×2): qty 1

## 2016-11-22 NOTE — Progress Notes (Signed)
This is a no charge note  Pending admission per Dr. Dina Rich  46 year old lady with past medical history of hypermenorrhea, hypertension, and deficiency anemia, migraine headaches, who presents with generalized weakness and SOB. Found to have symptomatic anemia with Hgb 5.8. Patient has been having heavy menstrual periods in the past 3 weeks, still has vaginal bleeding. EDP dicussed with on-call OB/Gyn doctor, who recommended to start patient with Megace 80 mg twice a day. Pt will be transfused with 3 units of blood. Placed on tele bed for obs,   Ivor Costa, MD  [facility] Pager [phone]  If 7PM-7AM, please contact night-coverage [email or web address] Password [email or web address] 11/22/2016, 1:33 AM

## 2016-11-22 NOTE — H&P (Addendum)
History and Physical    XOEY WARMOTH CXK:[id number] DOB: [birth year] DOA: 11/21/2016  PCP: Glendon Axe, MD   Patient coming from: Home  Chief Complaint: Weakness and dyspnea with low hemoglobin  HPI: Suzanne Aguirre is a 46 y.o. female with medical history significant of dysfunctional uterine bleeding with menorrhagia, iron deficiency anemia, and hypertension who apparently saw her primary care physician on 11/16 and was noted to have a hemoglobin of 5.5.  She states that she has been having heavy vaginal bleeding for the last 3 weeks.  She initially had some blood clots in the discharge as well and had to change her pads quite frequently.  She states that her menstrual cycle is coming to a close because it usually last approximately 10 days and her bleeding is starting to subside.  She has developed generalized fatigue as well as some shortness of breath and currently does not have a primary OB/GYN that she follows with.  She has received prior transfusions and has been hospitalized twice for this.  ED Course: Her low hemoglobin level was confirmed in the emergency department and her vital signs were noted to be stable.  ED physician has spoken with [facility] of OB/[facility] who recommended Megace 80 mg twice a day plus iron daily which has been initiated.  Patient is admitted not only for symptomatic anemia with need for transfusion, but has had prior allergic reaction during transfusion for which she is currently anxious.  She has thus far received 2 units of PRBC and is starting to feel less symptomatic.  She continues to have vaginal bleeding, but she states that this has slowed down.  Review of Systems: As per HPI otherwise 10 point review of systems negative.   Past Medical History:  Diagnosis Date  . Abnormal Pap smear   . Anemia 05/14/2016  . History of blood transfusion 05/2015; 05/14/2016   "related to anemia; related to anemia"  . Hypertension   . Migraine    "monthly"  (05/14/2016)  . Symptomatic anemia 05/14/2016    Past Surgical History:  Procedure Laterality Date  . COLPOSCOPY    . INTRAUTERINE DEVICE INSERTION  <2013  . TUBAL LIGATION  05/30/2011     reports that  has never smoked. she has never used smokeless tobacco. She reports that she does not drink alcohol or use drugs.  Allergies  Allergen Reactions  . Fish Allergy Anaphylaxis  . Doxycycline Hives  . Meclizine Nausea And Vomiting    Family History  Problem Relation Age of Onset  . Hypertension Mother   . Hypertension Father   . Diabetes Mellitus II Father   . Prostate cancer Father   . Hypertension Paternal Grandmother   . Hypertension Paternal Grandfather     Prior to Admission medications   Medication Sig Start Date End Date Taking? Authorizing Provider  iron polysaccharides (NIFEREX) 150 MG capsule Take 1 capsule (150 mg total) by mouth daily. 05/16/16  Yes Alphonzo Grieve, MD  estrogens, conjugated, (PREMARIN) 1.25 MG tablet Take 2 tablets (2.5 mg total) by mouth 2 (two) times daily. Patient not taking: Reported on 11/22/2016 05/15/16 11/23/22  Alphonzo Grieve, MD    Physical Exam: Vitals:   11/22/16 0415 11/22/16 0456 11/22/16 0525 11/22/16 0540  BP: (!) 103/58 124/65 130/76 128/76  Pulse: 90 94 90 89  Resp:  15 20 18   Temp:  98.2 F (36.8 C) 98.2 F (36.8 C) 98.3 F (36.8 C)  TempSrc:  Oral Oral Oral  SpO2: 100% 100%  100%  Height:  5\' 5"  (1.651 m)      Constitutional: NAD, calm, comfortable Vitals:   11/22/16 0415 11/22/16 0456 11/22/16 0525 11/22/16 0540  BP: (!) 103/58 124/65 130/76 128/76  Pulse: 90 94 90 89  Resp:  15 20 18   Temp:  98.2 F (36.8 C) 98.2 F (36.8 C) 98.3 F (36.8 C)  TempSrc:  Oral Oral Oral  SpO2: 100% 100%  100%  Height:  5\' 5"  (1.651 m)     Eyes: PERRL, lids and conjunctivae normal ENMT: Mucous membranes are moist. Posterior pharynx clear of any exudate or lesions.Normal dentition.  Neck: normal, supple, no masses, no  thyromegaly Respiratory: clear to auscultation bilaterally, no wheezing, no crackles. Normal respiratory effort. No accessory muscle use.  Cardiovascular: Regular rate and rhythm, no murmurs / rubs / gallops. No extremity edema. 2+ pedal pulses. No carotid bruits.  Abdomen: no tenderness, no masses palpated. No hepatosplenomegaly. Bowel sounds positive.  Musculoskeletal: no clubbing / cyanosis. No joint deformity upper and lower extremities. Good ROM, no contractures. Normal muscle tone.  Skin: no rashes, lesions, ulcers. No induration Neurologic: CN 2-12 grossly intact. Sensation intact, DTR normal. Strength 5/5 in all 4.  Psychiatric: Normal judgment and insight. Alert and oriented x 3. Normal mood.   Labs on Admission: I have personally reviewed following labs and imaging studies  CBC: Recent Labs  Lab 11/21/16 2340  WBC 6.7  HGB 5.8*  HCT 20.2*  MCV 70.9*  PLT 366   Basic Metabolic Panel: Recent Labs  Lab 11/21/16 2340  NA 136  K 4.0  CL 106  CO2 23  GLUCOSE 108*  BUN 9  CREATININE 0.53  CALCIUM 9.0   GFR: CrCl cannot be calculated (Unknown ideal weight.). Liver Function Tests: Recent Labs  Lab 11/21/16 2340  AST 25  ALT 10*  ALKPHOS 34*  BILITOT 0.5  PROT 7.0  ALBUMIN 3.8   No results for input(s): LIPASE, AMYLASE in the last 168 hours. No results for input(s): AMMONIA in the last 168 hours. Coagulation Profile: No results for input(s): INR, PROTIME in the last 168 hours. Cardiac Enzymes: No results for input(s): CKTOTAL, CKMB, CKMBINDEX, TROPONINI in the last 168 hours. BNP (last 3 results) No results for input(s): PROBNP in the last 8760 hours. HbA1C: No results for input(s): HGBA1C in the last 72 hours. CBG: No results for input(s): GLUCAP in the last 168 hours. Lipid Profile: No results for input(s): CHOL, HDL, LDLCALC, TRIG, CHOLHDL, LDLDIRECT in the last 72 hours. Thyroid Function Tests: No results for input(s): TSH, T4TOTAL, FREET4, T3FREE,  THYROIDAB in the last 72 hours. Anemia Panel: Recent Labs    11/21/16 2340  VITAMINB12 248  FOLATE 13.1  FERRITIN 1*  TIBC 494*  IRON 9*  RETICCTPCT 1.8   Urine analysis:    Component Value Date/Time   COLORURINE YELLOW 05/08/2015 1848   APPEARANCEUR CLEAR 05/08/2015 1848   LABSPEC 1.020 05/08/2015 1848   PHURINE 7.0 05/08/2015 1848   GLUCOSEU NEGATIVE 05/08/2015 1848   HGBUR MODERATE (A) 05/08/2015 1848   BILIRUBINUR NEGATIVE 05/08/2015 1848   KETONESUR NEGATIVE 05/08/2015 1848   PROTEINUR NEGATIVE 05/08/2015 1848   UROBILINOGEN 1.0 05/26/2008 0017   NITRITE NEGATIVE 05/08/2015 1848   LEUKOCYTESUR NEGATIVE 05/08/2015 1848    Radiological Exams on Admission: No results found.   Assessment/Plan Principal Problem:   Symptomatic anemia Active Problems:   Hypertension   Hypermenorrhea   Acute blood loss anemia   Acute symptomatic blood loss anemia secondary  to menorrhagia in setting of severe Fe-deficiency anemia -Transfuse 3 unit PRBC; thus far 2 units have been transfused and patient is much less symptomatic -Feraheme x 1 today -Continue on Megace as well as home Premarin and iron supplementation as recommended by gynecology -Appreciate further gynecology recommendations; ED physician has spoken with Dr. Harolyn Rutherford  Menorrhagia secondary to dysfunctional uterine bleeding -Appreciate gynecology recommendations -Will need to establish a new OB/GYN to follow up with on DC  Hypertension-controlled -Continue to monitor blood pressure and initiate medications as needed especially after transfusion  DVT prophylaxis: SCDs Code Status: Full Family Communication: None Disposition Plan: Home in 24 hours after transfusion and improvement in menorrhagia Consults called: EDP spoke with Dr. Harolyn Rutherford Admission status: obs/med-surg   Pratik D Manuella Ghazi DO [facility] Pager [phone]  If 7PM-7AM, please contact night-coverage [email or web address] Password [email or web address]  11/22/2016,  9:13 AM

## 2016-11-22 NOTE — Progress Notes (Signed)
Called ER RN for report. Room ready.  

## 2016-11-22 NOTE — ED Provider Notes (Signed)
[facility] EMERGENCY DEPARTMENT Provider Note   CSN: [id number] Arrival date & time: 11/21/16  2321     History   Chief Complaint Chief Complaint  Patient presents with  . Abnormal Lab    HPI Suzanne Aguirre is a 46 y.o. female.  HPI  This is a 46 year old female with a history of anemia, hypertension, dysfunctional uterine bleeding who presents with anemia.  Patient reports that she saw her primary physician earlier today.  She was found to have a hemoglobin of 5.5.  She states that at this time she has been bleeding vaginally for the last 3 weeks.  At baseline she has heavy periods the last 7-10 days.  She states initially during this bleeding episode she had multiple clots and changed her pad up to 10 times a day.  She states that has somewhat improved but she is still changing her pad 3 times a day.  She reports generalized fatigue and shortness of breath.  She is currently in between OB/GYN's.  She does have an appointment to discuss treatment options at the end of December with [facility].  She has a history of requiring transfusions and has been hospitalized twice.  She reports that during her last hospitalization she believes she had a reaction related to transfusion that involved facial swelling.  Past Medical History:  Diagnosis Date  . Abnormal Pap smear   . Anemia 05/14/2016  . History of blood transfusion 05/2015; 05/14/2016   "related to anemia; related to anemia"  . Hypertension   . Migraine    "monthly" (05/14/2016)  . Symptomatic anemia 05/14/2016    Patient Active Problem List   Diagnosis Date Noted  . Symptomatic anemia 05/08/2015  . Chest pressure 05/08/2015  . Hypertension 05/08/2015  . Hypermenorrhea 05/08/2015    Past Surgical History:  Procedure Laterality Date  . COLPOSCOPY    . INTRAUTERINE DEVICE INSERTION  <2013  . TUBAL LIGATION  05/30/2011    OB History    Gravida Para Term Preterm AB Living   4 4 4     4    SAB TAB Ectopic Multiple Live Births           4       Home Medications    Prior to Admission medications   Medication Sig Start Date End Date Taking? Authorizing Provider  iron polysaccharides (NIFEREX) 150 MG capsule Take 1 capsule (150 mg total) by mouth daily. 05/16/16  Yes Alphonzo Grieve, MD  estrogens, conjugated, (PREMARIN) 1.25 MG tablet Take 2 tablets (2.5 mg total) by mouth 2 (two) times daily. Patient not taking: Reported on 11/22/2016 05/15/16 11/23/22  Alphonzo Grieve, MD    Family History Family History  Problem Relation Age of Onset  . Hypertension Mother   . Hypertension Father   . Diabetes Mellitus II Father   . Prostate cancer Father   . Hypertension Paternal Grandmother   . Hypertension Paternal Grandfather     Social History Social History   Tobacco Use  . Smoking status: Never Smoker  . Smokeless tobacco: Never Used  Substance Use Topics  . Alcohol use: No  . Drug use: No     Allergies   Fish allergy; Doxycycline; and Meclizine   Review of Systems Review of Systems  Constitutional: Positive for fatigue. Negative for fever.  Respiratory: Positive for shortness of breath.   Cardiovascular: Negative for chest pain.  Gastrointestinal: Negative for abdominal pain, nausea and vomiting.  Genitourinary: Negative for dysuria.  Neurological: Positive for weakness.  All other systems reviewed and are negative.    Physical Exam Updated Vital Signs BP (!) 141/75 (BP Location: Right Arm)   Pulse 91   Temp 98.1 F (36.7 C) (Oral)   Resp 18   LMP 11/06/2016   SpO2 100%   Physical Exam  Constitutional: She is oriented to person, place, and time. She appears well-developed and well-nourished. No distress.  HENT:  Head: Normocephalic and atraumatic.  Conjunctival pale  Cardiovascular: Normal rate, regular rhythm and normal heart sounds.  No murmur heard. Pulmonary/Chest: Effort normal and breath sounds normal. No respiratory distress. She has no  wheezes.  Abdominal: Soft. There is no tenderness.  Genitourinary:  Genitourinary Comments: Deferred  Neurological: She is alert and oriented to person, place, and time.  Skin: Skin is warm and dry.  Psychiatric: She has a normal mood and affect.  Nursing note and vitals reviewed.    ED Treatments / Results  Labs (all labs ordered are listed, but only abnormal results are displayed) Labs Reviewed  COMPREHENSIVE METABOLIC PANEL - Abnormal; Notable for the following components:      Result Value   Glucose, Bld 108 (*)    ALT 10 (*)    Alkaline Phosphatase 34 (*)    All other components within normal limits  CBC - Abnormal; Notable for the following components:   RBC 2.85 (*)    Hemoglobin 5.8 (*)    HCT 20.2 (*)    MCV 70.9 (*)    MCH 20.4 (*)    MCHC 28.7 (*)    RDW 17.8 (*)    All other components within normal limits  TYPE AND SCREEN  PREPARE RBC (CROSSMATCH)    EKG  EKG Interpretation None       Radiology No results found.  Procedures Procedures (including critical care time)  CRITICAL CARE Performed by: Merryl Hacker   Total critical care time: 45 minutes  Critical care time was exclusive of separately billable procedures and treating other patients.  Critical care was necessary to treat or prevent imminent or life-threatening deterioration.  Critical care was time spent personally by me on the following activities: development of treatment plan with patient and/or surrogate as well as nursing, discussions with consultants, evaluation of patient's response to treatment, examination of patient, obtaining history from patient or surrogate, ordering and performing treatments and interventions, ordering and review of laboratory studies, ordering and review of radiographic studies, pulse oximetry and re-evaluation of patient's condition.   Medications Ordered in ED Medications  0.9 %  sodium chloride infusion (not administered)  megestrol (MEGACE)  tablet 80 mg (not administered)     Initial Impression / Assessment and Plan / ED Course  I have reviewed the triage vital signs and the nursing notes.  Pertinent labs & imaging results that were available during my care of the patient were reviewed by me and considered in my medical decision making (see chart for details).     Patient presents with symptomatic anemia and a history of dysfunctional uterine bleeding.  Vital signs are currently stable.  Hemoglobin is 5.5.  I discussed with the patient options including transfusion in the ED and discharged with close follow-up versus admission.  Patient is very anxious given the previous reaction during transfusion.  She also required 4 units of blood during her last hospitalization.  Suspect symptomatic anemia is from known dysfunctional uterine bleeding.  She is having ongoing bleeding at this time.  I discussed with  Dr. Harolyn Rutherford, OB/GYN, options to temporize bleeding prior to follow-up.  She recommends Megace 80 mg twice daily plus iron daily.  If patient has cessation of bleeding and begins to have side effects from Megace including breast tenderness, nausea, vomiting, she can attempt to attempt taper to 40 mg twice daily.  Given patient's prior history and reported allergic reaction during transfusion, will admit for observation and transfusion.  Final Clinical Impressions(s) / ED Diagnoses   Final diagnoses:  Symptomatic anemia  Dysfunctional uterine bleeding    ED Discharge Orders    None       Merryl Hacker, MD 11/22/16 [phone]

## 2016-11-22 NOTE — ED Notes (Signed)
Report attempted x 1  Floor advises pt's bed was just approved by charge

## 2016-11-23 DIAGNOSIS — N938 Other specified abnormal uterine and vaginal bleeding: Secondary | ICD-10-CM

## 2016-11-23 DIAGNOSIS — N920 Excessive and frequent menstruation with regular cycle: Secondary | ICD-10-CM | POA: Diagnosis not present

## 2016-11-23 DIAGNOSIS — D62 Acute posthemorrhagic anemia: Secondary | ICD-10-CM | POA: Diagnosis not present

## 2016-11-23 DIAGNOSIS — D649 Anemia, unspecified: Secondary | ICD-10-CM

## 2016-11-23 LAB — BASIC METABOLIC PANEL
Anion gap: 5 (ref 5–15)
BUN: 9 mg/dL (ref 6–20)
CHLORIDE: 107 mmol/L (ref 101–111)
CO2: 26 mmol/L (ref 22–32)
CREATININE: 0.67 mg/dL (ref 0.44–1.00)
Calcium: 8.6 mg/dL — ABNORMAL LOW (ref 8.9–10.3)
GFR calc Af Amer: >60 mL/min (ref >60)
GFR calc non Af Amer: >60 mL/min (ref >60)
GLUCOSE: 97 mg/dL (ref 65–99)
POTASSIUM: 4 mmol/L (ref 3.5–5.1)
SODIUM: 138 mmol/L (ref 135–145)

## 2016-11-23 LAB — TYPE AND SCREEN
ABO/RH(D): A POS
Antibody Screen: NEGATIVE
UNIT DIVISION: 0
UNIT DIVISION: 0
Unit division: 0

## 2016-11-23 LAB — HIV ANTIBODY (ROUTINE TESTING W REFLEX): HIV SCREEN 4TH GENERATION: NONREACTIVE

## 2016-11-23 LAB — BPAM RBC
BLOOD PRODUCT EXPIRATION DATE: 201811232359
Blood Product Expiration Date: 201812062359
Blood Product Expiration Date: 201812062359
ISSUE DATE / TIME: 201811170149
ISSUE DATE / TIME: 201811170510
ISSUE DATE / TIME: 201811170952
UNIT TYPE AND RH: 600
UNIT TYPE AND RH: 6200
UNIT TYPE AND RH: 6200

## 2016-11-23 LAB — CBC
HEMATOCRIT: 29.8 % — AB (ref 36.0–46.0)
Hemoglobin: 9.2 g/dL — ABNORMAL LOW (ref 12.0–15.0)
MCH: 23.4 pg — ABNORMAL LOW (ref 26.0–34.0)
MCHC: 30.9 g/dL (ref 30.0–36.0)
MCV: 75.6 fL — AB (ref 78.0–100.0)
PLATELETS: 184 10*3/uL (ref 150–400)
RBC: 3.94 MIL/uL (ref 3.87–5.11)
RDW: 18.2 % — AB (ref 11.5–15.5)
WBC: 7.2 10*3/uL (ref 4.0–10.5)

## 2016-11-23 MED ORDER — ONDANSETRON 4 MG PO TBDP: 4.0000 mg | ORAL_TABLET | Freq: Three times a day (TID) | ORAL | 0 refills | Status: DC | PRN

## 2016-11-23 MED ORDER — MEGESTROL ACETATE 40 MG PO TABS: ORAL_TABLET | ORAL | 1 refills | Status: DC

## 2016-11-23 MED ORDER — POLYSACCHARIDE IRON COMPLEX 150 MG PO CAPS: 150.0000 mg | ORAL_CAPSULE | Freq: Every day | ORAL | 2 refills | Status: AC

## 2016-11-23 NOTE — Discharge Summary (Addendum)
Physician Discharge Summary   Patient ID: Suzanne Aguirre MRN: [id number] DOB/AGE: 46/16/1972 46 y.o.  Admit date: 11/21/2016 Discharge date: 11/23/2016  Primary Care Physician:  Glendon Axe, MD  Discharge Diagnoses:    . Acute blood loss symptomatic anemia . Hypertension . Hypermenorrhea . Dysfunctional uterine bleeding   Consults: OBGYN   Recommendations for Outpatient Follow-up:  1. Patient was recommended Megace 80mg  BID until it stops bleeding, then taper to 40 mg twice a day.  Patient has appointment with [facility] on 11/28 2. Please repeat CBC/BMET at next visit   DIET: Heart healthy diet    Allergies:   Allergies  Allergen Reactions  . Fish Allergy Anaphylaxis  . Doxycycline Hives  . Meclizine Nausea And Vomiting     DISCHARGE MEDICATIONS: Current Discharge Medication List    START taking these medications   Details  megestrol (MEGACE) 40 MG tablet Take 2 tabs (80mg ) twice a day for next 3 days. If bleeding stops, taper to 1 tab (40mg ) twice a day. Qty: 120 tablet, Refills: 1    ondansetron (ZOFRAN ODT) 4 MG disintegrating tablet Take 1 tablet (4 mg total) every 8 (eight) hours as needed by mouth for nausea or vomiting. Qty: 30 tablet, Refills: 0      CONTINUE these medications which have CHANGED   Details  iron polysaccharides (NIFEREX) 150 MG capsule Take 1 capsule (150 mg total) daily by mouth. Qty: 90 capsule, Refills: 2      STOP taking these medications     estrogens, conjugated, (PREMARIN) 1.25 MG tablet          Brief H and P: For complete details please refer to admission H and P, but in brief  Suzanne Aguirre is a 46 y.o. female with medical history significant of dysfunctional uterine bleeding with menorrhagia, iron deficiency anemia, and hypertension who apparently saw her primary care physician on 11/16 and was noted to have a hemoglobin of 5.5.  She states that she has been having heavy vaginal bleeding for  the last 3 weeks.   Hemoglobin in ED 5.8   Patient was admitted for blood transfusion.   Hospital Course:   Acute blood loss symptomatic anemia secondary to menorrhagia/DUB -Transfused Feraheme, 3 units of packed RBCs, hemoglobin upon discharge 9.2 -OB/GYN  Dr Harolyn Rutherford was consulted by ED, recommended Megace 80 mg twice daily plus iron.  If patient has cessation of bleeding and begins to have side effects from Megace, taper to 40 mg twice daily. -Patient has transabdominal and transvaginal ultrasound in 05/2016 which had shown retroverted uterus, normal endometrial thickness, myometrial endometrial junction indistint, however endometrial thickness approximately 11 mm.  Patient was recommended sonohysterogram if she continues to have bleeding unresponsive to hormonal or medical therapy. -Currently feels back to baseline, patient reports that she has appointment with [facility]    Hypertension -Not on any antihypertensives   Day of Discharge BP 127/65 (BP Location: Left Arm)   Pulse 66   Temp 98.1 F (36.7 C) (Oral)   Resp 18   Ht 5\' 5"  (1.651 m)   LMP 11/06/2016   SpO2 100%   BMI 26.29 kg/m   Physical Exam: General: Alert and awake oriented x3 not in any acute distress. HEENT: anicteric sclera, pupils reactive to light and accommodation CVS: S1-S2 clear no murmur rubs or gallops Chest: clear to auscultation bilaterally, no wheezing rales or rhonchi Abdomen: soft nontender, nondistended, normal bowel sounds Extremities: no cyanosis, clubbing or edema noted bilaterally Neuro: Cranial  nerves II-XII intact, no focal neurological deficits   The results of significant diagnostics from this hospitalization (including imaging, microbiology, ancillary and laboratory) are listed below for reference.    LAB RESULTS: Basic Metabolic Panel: Recent Labs  Lab 11/22/16 1459 11/23/16 0338  NA 138 138  K 3.6 4.0  CL 106 107  CO2 28 26  GLUCOSE 96 97  BUN 10 9  CREATININE 0.77  0.67  CALCIUM 8.8* 8.6*   Liver Function Tests: Recent Labs  Lab 11/21/16 2340  AST 25  ALT 10*  ALKPHOS 34*  BILITOT 0.5  PROT 7.0  ALBUMIN 3.8   No results for input(s): LIPASE, AMYLASE in the last 168 hours. No results for input(s): AMMONIA in the last 168 hours. CBC: Recent Labs  Lab 11/22/16 1924 11/23/16 0338  WBC 9.5 7.2  HGB 9.0* 9.2*  HCT 29.6* 29.8*  MCV 75.1* 75.6*  PLT 197 184   Cardiac Enzymes: No results for input(s): CKTOTAL, CKMB, CKMBINDEX, TROPONINI in the last 168 hours. BNP: Invalid input(s): POCBNP CBG: No results for input(s): GLUCAP in the last 168 hours.  Significant Diagnostic Studies:  No results found.  2D ECHO:   Disposition and Follow-up: Discharge Instructions    Diet - low sodium heart healthy   Complete by:  As directed    Discharge instructions   Complete by:  As directed    Take Megace 2 tabs (80mg ) twice a day for next 3 days. If bleeding stops, taper to 1 tab (40mg ) twice a day. If you have any side effect like breast tenderness, persistent nausea or vomiting, you can taper to 40mg  daily until your follow up appt with OBGYN on 12/28.   Increase activity slowly   Complete by:  As directed        DISPOSITION: home    [place]    Glendon Axe, MD. Schedule an appointment as soon as possible for a visit in 2 week(s).   Specialty:  Family Medicine Contact information: [address] [place] [address] [phone]            Time spent on Discharge: 1mins   Signed:   Estill Cotta M.D. [facility] 11/23/2016, 9:55 AM Pager: [phone]

## 2017-01-02 ENCOUNTER — Ambulatory Visit: Payer: BLUE CROSS/BLUE SHIELD

## 2017-01-29 ENCOUNTER — Other Ambulatory Visit: Payer: Self-pay | Admitting: Obstetrics and Gynecology

## 2017-02-04 NOTE — Patient Instructions (Signed)
Your procedure is scheduled on: Thursday February 19, 2017 at 12:00 pm  Enter through the Main Entrance of [facility] - Eat at: 10:30 am  Pick up the phone at the desk and dial [phone].  Call this number if you have problems the morning of surgery: [phone].  Remember: Do NOT eat food: after Midnight on Wednesday February 13 Do NOT drink clear liquids after: 6:00 am Take these medicines the morning of surgery with a SIP OF WATER:  Do NOT wear jewelry (body piercing), metal hair clips/bobby pins, make-up, or nail polish. Do NOT wear lotions, powders, or perfumes.  You may wear deoderant. Do NOT shave for 48 hours prior to surgery. Do NOT bring valuables to the hospital. Contacts, dentures, or bridgework may not be worn into surgery. Leave suitcase in car.  After surgery it may be brought to your room.    For patients admitted to the hospital, checkout time is 11:00 AM the day of discharge.

## 2017-02-11 NOTE — H&P (Addendum)
Suzanne Aguirre is a 47 y.o.  female, P: 4-0-0-4 who presents for hysterectomy because of menorrhagia and anemia. Since menarche,  the patient describes  a history of heavy menstrual periods that over the past year  have worsened. Though her typical menstrual period may last 7- 10 days with the need to change her pad every 2 hours, she has recently had her flow to last up to 30 days. Each bleeding episode is accompanied by intense cramping that she rates 9/10 on a 10 point pain scale but fortunately responds to Ibuprofen 800 mg. In November of 2018 Suzanne Aguirre was transfused 2 units of packed red blood cells for a hemoglobin of 5.8 that resulted from a heavy and prolong menstrual episode. Following that event she was placed on various combinations of hormonal therapies to control her bleeding to include combination oral contraceptives. She was finally able to achieve cycle control with Megace 20 mg  a day when taken during her menstrual period. She denies any changes in bowel or bladder function and has not had any dyspareunia. A pelvic ultrasound in January 2019 showed a retroverted uterus,  with features of adenomyosis,  and measuring: 9.92 x 6.61 x 6.81 cm (length from fundus to external os was 12 cm) , endometrium: 1.14 cm; right ovary: 3.44 cm and left ovary: 2.77 cm. An endometrial biopsy at that same time did not show any hyperplasia, atypia or malignancy and TSH was normal. On February 13, 2017,  the patient's hemoglobin was 9.2 and she continues  her regimen of iron supplementation and Megace. A review of both medical and surgical management options for menorrhagia were given to the patient and given the disruptive and protracted nature of her symptoms she has decided to proceed with definitive therapy in the form of hysterectomy.    Past Medical History  OB History: G:4;  P: 4-0-0-4; SVB: 1192, 1992, 2007 and 2013  (largest infant weighed 7 lbs. 8 oz).   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 Fish Allergy Anaphylaxis  . Doxycycline Hives  . Meclizine Nausea And Vomiting   Denies sensitivity to peanuts,  soy, latex or adhesives.  ROS: Admits to reading  glasses and occasional rashes related to eczema but  denies headache, vision changes, nasal congestion, dysphagia, tinnitus, dizziness, hoarseness, cough,  chest pain, shortness of breath, nausea, vomiting, diarrhea,constipation,  urinary frequency, urgency  dysuria, hematuria, vaginitis symptoms, swelling of joints,easy bruising,  myalgias, arthralgias, sunexplained weight loss and except as is mentioned in the history of present illness, patient's review of systems is otherwise negative.     Physical Exam  Bp: 126/70   P: 56 bpm  Temperature: 98.8 degrees F orally  Weight: 174 lbs.   Height: 5' 3.75"  BMI:30.1  Neck: supple without masses or thyromegaly Lungs: clear to auscultation Heart: regular rate and rhythm Abdomen: soft, non-tender and no organomegaly Pelvic:EGBUS- wnl; vagina-normal rugae; uterus-retroverted, 12-14 weeks size, cervix is displaced anteriorly  without lesions or motion tenderness; adnexae-no tenderness or masses Extremities:  no clubbing, cyanosis  or edema   Assesment: Menorrhagia                      History of Anemia                      Dysmenorrhea                       Adenomyosis   Disposition: : A discussion was held with patient regarding the indication for her procedure(s) along with the risks, which include but are not limited OF:[id number] to anesthesia, damage to adjacent organs, infection, excessive bleeding, formation of scar tissue, early menopause, pelvic prolapse and the possible need for an open abdominal incision.    Benefits of the robotic approach to hysterectomy were reviewed to include lesser postoperative pain, less blood loss during surgery, reduced risk of injury to other organs due to better visualization with a 3-D HD 10 times magnifying camera, shorter hospital stay between 0-1 night and rapid recovery with return to daily routine in 2-3 weeks. Although robotically-assisted hysterectomy has a longer operative time than traditional laparotomy, in a patient with good medical history, the benefits usually outweigh the risks.\or preventative removal of the ovaries was also reviewed and left to the patient's discretion.   Finally, the option of supracervical hysterectomy was also discussed with the possible but yet unconfirmed benefits of reduction of pelvic prolapse. If supracervical hysterectomy is performed, Pap smear screening would continue as currently recommended, monthly bleeding is possible despite cauterization of cervical canal and a small but possible risk of cervical fibroid development is present.    Informed about FDA warning on use of morcellator dated 04/22/2012. The discussion included:  1. Incidence of post-operative diagnosis of sarcoma in women undergoing a hysterectomy is 2:1000  2. Annual incidence of leiomyosarcoma is 0.64/100,000 women  3. Sarcomas have the highest incidence in women over 65  4. Power morcellation involves risks of spreading tissue / disease. In he case of undiagnosed cancer, it  may adversely affect the patient's prognosis.  The patient was given the Miralax bowel prep to be completed 24 hours prior to procedure. She verbalized understanding of these risks and pre-operative instructions and has consented to proceed with a Robot Assisted Total Laparoscopic Hysterectomy with Bilateral Salpingectomy at [facility] on February 27, 2017.   CSN# [id number]   [place] J. Florene Glen, PA-C  for Dr. Dede Query. Kamali Sakata  Procedure reviewed and questions answered

## 2017-02-12 NOTE — Patient Instructions (Addendum)
Ai Yuliet Needs  02/12/2017   Your procedure is scheduled on: 02/27/2017    Report to [facility] Main  Entrance  Report to admitting at    1000 AM   Call this number if you have problems the morning of surgery [phone]   Remember: Do not eat food or drink liquids :After Midnight.     Take these medicines the morning of surgery with A SIP OF WATER:  Megestrol                                You may not have any metal on your body including hair pins and              piercings  Do not wear jewelry, make-up, lotions, powders or perfumes, deodorant             Do not wear nail polish.  Do not shave  48 hours prior to surgery.              Do not bring valuables to the hospital. [place].  Contacts, dentures or bridgework may not be worn into surgery.                 Please read over the following fact sheets you were given: _____________________________________________________________________             [facility] - Preparing for Surgery Before surgery, you can play an important role.  Because skin is not sterile, your skin needs to be as free of germs as possible.  You can reduce the number of germs on your skin by washing with CHG (chlorahexidine gluconate) soap before surgery.  CHG is an antiseptic cleaner which kills germs and bonds with the skin to continue killing germs even after washing. Please DO NOT use if you have an allergy to CHG or antibacterial soaps.  If your skin becomes reddened/irritated stop using the CHG and inform your nurse when you arrive at Short Stay. Do not shave (including legs and underarms) for at least 48 hours prior to the first CHG shower.  You may shave your face/neck. Please follow these instructions carefully:  1.  Shower with CHG Soap the night before surgery and the  morning of Surgery.  2.  If you choose to wash your hair, wash your hair first as usual with  your  normal  shampoo.  3.  After you shampoo, rinse your hair and body thoroughly to remove the  shampoo.                           4.  Use CHG as you would any other liquid soap.  You can apply chg directly  to the skin and wash                       Gently with a scrungie or clean washcloth.  5.  Apply the CHG Soap to your body ONLY FROM THE NECK DOWN.   Do not use on face/ open                           Wound or  open sores. Avoid contact with eyes, ears mouth and genitals (private parts).                       Wash face,  Genitals (private parts) with your normal soap.             6.  Wash thoroughly, paying special attention to the area where your surgery  will be performed.  7.  Thoroughly rinse your body with warm water from the neck down.  8.  DO NOT shower/wash with your normal soap after using and rinsing off  the CHG Soap.                9.  Pat yourself dry with a clean towel.            10.  Wear clean pajamas.            11.  Place clean sheets on your bed the night of your first shower and do not  sleep with pets. Day of Surgery : Do not apply any lotions/deodorants the morning of surgery.  Please wear clean clothes to the hospital/surgery center.  FAILURE TO FOLLOW THESE INSTRUCTIONS MAY RESULT IN THE CANCELLATION OF YOUR SURGERY PATIENT SIGNATURE_________________________________  NURSE SIGNATURE__________________________________  ________________________________________________________________________

## 2017-02-13 ENCOUNTER — Encounter (HOSPITAL_COMMUNITY): Payer: Self-pay

## 2017-02-13 ENCOUNTER — Other Ambulatory Visit: Payer: Self-pay

## 2017-02-13 ENCOUNTER — Inpatient Hospital Stay (HOSPITAL_COMMUNITY)
Admission: RE | Admit: 2017-02-13 | Discharge: 2017-02-13 | Disposition: A | Discharge status: Home / Self Care | Payer: BLUE CROSS/BLUE SHIELD | Source: Ambulatory Visit

## 2017-02-13 ENCOUNTER — Encounter (HOSPITAL_COMMUNITY)
Admission: RE | Admit: 2017-02-13 | Discharge: 2017-02-13 | Disposition: A | Discharge status: Home / Self Care | Payer: BLUE CROSS/BLUE SHIELD | Source: Ambulatory Visit | Attending: Obstetrics and Gynecology | Admitting: Obstetrics and Gynecology

## 2017-02-13 DIAGNOSIS — Z01812 Encounter for preprocedural laboratory examination: Secondary | ICD-10-CM | POA: Diagnosis present

## 2017-02-13 DIAGNOSIS — N920 Excessive and frequent menstruation with regular cycle: Secondary | ICD-10-CM | POA: Diagnosis not present

## 2017-02-13 HISTORY — DX: Other specified postprocedural states: Z98.890

## 2017-02-13 HISTORY — DX: Cardiac murmur, unspecified: R01.1

## 2017-02-13 HISTORY — DX: Personal history of other complications of pregnancy, childbirth and the puerperium: Z87.59

## 2017-02-13 HISTORY — DX: Other specified postprocedural states: R11.2

## 2017-02-13 LAB — CBC
HEMATOCRIT: 27.7 % — AB (ref 36.0–46.0)
HEMOGLOBIN: 9.2 g/dL — AB (ref 12.0–15.0)
MCH: 28.9 pg (ref 26.0–34.0)
MCHC: 33.2 g/dL (ref 30.0–36.0)
MCV: 87.1 fL (ref 78.0–100.0)
Platelets: 327 10*3/uL (ref 150–400)
RBC: 3.18 MIL/uL — ABNORMAL LOW (ref 3.87–5.11)
RDW: 17.2 % — AB (ref 11.5–15.5)
WBC: 8.2 10*3/uL (ref 4.0–10.5)

## 2017-02-13 LAB — BASIC METABOLIC PANEL
Anion gap: 4 — ABNORMAL LOW (ref 5–15)
BUN: 9 mg/dL (ref 6–20)
CHLORIDE: 109 mmol/L (ref 101–111)
CO2: 24 mmol/L (ref 22–32)
CREATININE: 0.66 mg/dL (ref 0.44–1.00)
Calcium: 8.7 mg/dL — ABNORMAL LOW (ref 8.9–10.3)
Glucose, Bld: 85 mg/dL (ref 65–99)
Potassium: 4 mmol/L (ref 3.5–5.1)
SODIUM: 137 mmol/L (ref 135–145)

## 2017-02-13 LAB — PREGNANCY, URINE: PREG TEST UR: NEGATIVE

## 2017-02-13 NOTE — Progress Notes (Signed)
Called and spoke w/ Dr Cletis Media via phone , asked if pt needed T&S.  Dr Cletis Media stated put T&S order, verbally read back to her, agreed.  Placed order in epic.

## 2017-02-13 NOTE — Progress Notes (Signed)
Routed CBC results dated 02-13-2017 to Dr Cletis Media in epic.  EKG dated 05-14-2016 in epic.

## 2017-02-27 ENCOUNTER — Ambulatory Visit (HOSPITAL_COMMUNITY)
Admission: RE | Admit: 2017-02-27 | Discharge: 2017-02-28 | Disposition: A | Discharge status: Home / Self Care | Payer: BLUE CROSS/BLUE SHIELD | Source: Ambulatory Visit | Attending: Obstetrics and Gynecology | Admitting: Obstetrics and Gynecology

## 2017-02-27 ENCOUNTER — Ambulatory Visit (HOSPITAL_COMMUNITY): Payer: BLUE CROSS/BLUE SHIELD | Admitting: Certified Registered Nurse Anesthetist

## 2017-02-27 ENCOUNTER — Encounter (HOSPITAL_COMMUNITY): Payer: Self-pay | Admitting: Certified Registered Nurse Anesthetist

## 2017-02-27 ENCOUNTER — Encounter (HOSPITAL_COMMUNITY)
Admission: RE | Disposition: A | Discharge status: Home / Self Care | Payer: Self-pay | Source: Ambulatory Visit | Attending: Obstetrics and Gynecology

## 2017-02-27 DIAGNOSIS — Z79890 Hormone replacement therapy (postmenopausal): Secondary | ICD-10-CM | POA: Insufficient documentation

## 2017-02-27 DIAGNOSIS — Z79899 Other long term (current) drug therapy: Secondary | ICD-10-CM | POA: Diagnosis not present

## 2017-02-27 DIAGNOSIS — Z888 Allergy status to other drugs, medicaments and biological substances status: Secondary | ICD-10-CM | POA: Insufficient documentation

## 2017-02-27 DIAGNOSIS — Z91013 Allergy to seafood: Secondary | ICD-10-CM | POA: Insufficient documentation

## 2017-02-27 DIAGNOSIS — D251 Intramural leiomyoma of uterus: Secondary | ICD-10-CM | POA: Diagnosis not present

## 2017-02-27 DIAGNOSIS — Z881 Allergy status to other antibiotic agents status: Secondary | ICD-10-CM | POA: Diagnosis not present

## 2017-02-27 DIAGNOSIS — D500 Iron deficiency anemia secondary to blood loss (chronic): Secondary | ICD-10-CM | POA: Diagnosis present

## 2017-02-27 DIAGNOSIS — N920 Excessive and frequent menstruation with regular cycle: Secondary | ICD-10-CM | POA: Insufficient documentation

## 2017-02-27 DIAGNOSIS — N800 Endometriosis of uterus: Secondary | ICD-10-CM | POA: Insufficient documentation

## 2017-02-27 HISTORY — PX: ROBOTIC ASSISTED LAPAROSCOPIC HYSTERECTOMY AND SALPINGECTOMY: SHX6379

## 2017-02-27 LAB — TYPE AND SCREEN
ABO/RH(D): A POS
Antibody Screen: NEGATIVE

## 2017-02-27 LAB — ABO/RH: ABO/RH(D): A POS

## 2017-02-27 SURGERY — XI ROBOTIC ASSISTED LAPAROSCOPIC HYSTERECTOMY AND SALPINGECTOMY
Anesthesia: General | Site: Abdomen | Laterality: Bilateral

## 2017-02-27 MED ORDER — PROMETHAZINE HCL 25 MG/ML IJ SOLN
INTRAMUSCULAR | Status: AC
  Filled 2017-02-27: qty 1

## 2017-02-27 MED ORDER — ROCURONIUM BROMIDE 10 MG/ML (PF) SYRINGE
PREFILLED_SYRINGE | INTRAVENOUS | Status: AC
  Filled 2017-02-27: qty 5

## 2017-02-27 MED ORDER — HYDROMORPHONE HCL 1 MG/ML IJ SOLN: 0.2500 mg | INTRAMUSCULAR | Status: DC | PRN

## 2017-02-27 MED ORDER — MIDAZOLAM HCL 2 MG/2ML IJ SOLN
INTRAMUSCULAR | Status: DC | PRN
  Administered 2017-02-27: 2 mg via INTRAVENOUS

## 2017-02-27 MED ORDER — ONDANSETRON HCL 4 MG/2ML IJ SOLN
INTRAMUSCULAR | Status: AC
  Filled 2017-02-27: qty 2

## 2017-02-27 MED ORDER — SODIUM CHLORIDE 0.9 % IJ SOLN
INTRAMUSCULAR | Status: AC
  Filled 2017-02-27: qty 50

## 2017-02-27 MED ORDER — PROMETHAZINE HCL 25 MG/ML IJ SOLN
6.2500 mg | INTRAMUSCULAR | Status: AC | PRN
  Administered 2017-02-27 (×2): 12.5 mg via INTRAVENOUS

## 2017-02-27 MED ORDER — SODIUM CHLORIDE 0.9 % IV SOLN
INTRAVENOUS | Status: DC | PRN
  Administered 2017-02-27: 40 mL

## 2017-02-27 MED ORDER — SCOPOLAMINE 1 MG/3DAYS TD PT72
1.0000 | MEDICATED_PATCH | TRANSDERMAL | Status: DC
  Administered 2017-02-27: 1.5 mg via TRANSDERMAL

## 2017-02-27 MED ORDER — ACETAMINOPHEN 10 MG/ML IV SOLN
INTRAVENOUS | Status: AC
  Filled 2017-02-27: qty 100

## 2017-02-27 MED ORDER — KETOROLAC TROMETHAMINE 30 MG/ML IJ SOLN
INTRAMUSCULAR | Status: DC | PRN
  Administered 2017-02-27: 30 mg via INTRAVENOUS

## 2017-02-27 MED ORDER — SODIUM CHLORIDE 0.9 % IR SOLN
Status: DC | PRN
  Administered 2017-02-27: 3000 mL

## 2017-02-27 MED ORDER — EPHEDRINE SULFATE 50 MG/ML IJ SOLN
INTRAMUSCULAR | Status: DC | PRN
  Administered 2017-02-27 (×2): 5 mg via INTRAVENOUS

## 2017-02-27 MED ORDER — LIDOCAINE HCL (CARDIAC) 20 MG/ML IV SOLN
INTRAVENOUS | Status: DC | PRN
  Administered 2017-02-27: 80 mg via INTRAVENOUS

## 2017-02-27 MED ORDER — ARTIFICIAL TEARS OPHTHALMIC OINT
TOPICAL_OINTMENT | OPHTHALMIC | Status: AC
  Filled 2017-02-27: qty 3.5

## 2017-02-27 MED ORDER — SODIUM CHLORIDE 0.9 % IJ SOLN
INTRAMUSCULAR | Status: AC
  Filled 2017-02-27: qty 10

## 2017-02-27 MED ORDER — ONDANSETRON HCL 4 MG/2ML IJ SOLN
INTRAMUSCULAR | Status: DC | PRN
  Administered 2017-02-27: 4 mg via INTRAVENOUS

## 2017-02-27 MED ORDER — FENTANYL CITRATE (PF) 250 MCG/5ML IJ SOLN
INTRAMUSCULAR | Status: AC
  Filled 2017-02-27: qty 5

## 2017-02-27 MED ORDER — DEXAMETHASONE SODIUM PHOSPHATE 10 MG/ML IJ SOLN
INTRAMUSCULAR | Status: DC | PRN
  Administered 2017-02-27: 10 mg via INTRAVENOUS

## 2017-02-27 MED ORDER — IBUPROFEN 200 MG PO TABS
ORAL_TABLET | ORAL | Status: AC
  Filled 2017-02-27: qty 3

## 2017-02-27 MED ORDER — IBUPROFEN 600 MG PO TABS
600.0000 mg | ORAL_TABLET | Freq: Four times a day (QID) | ORAL | Status: DC | PRN
  Administered 2017-02-27: 600 mg via ORAL

## 2017-02-27 MED ORDER — KETOROLAC TROMETHAMINE 30 MG/ML IJ SOLN
INTRAMUSCULAR | Status: AC
  Filled 2017-02-27: qty 1

## 2017-02-27 MED ORDER — LACTATED RINGERS IV SOLN
INTRAVENOUS | Status: DC
Start: 2017-02-27 — End: 2017-02-27
  Administered 2017-02-27 (×2): via INTRAVENOUS

## 2017-02-27 MED ORDER — MIDAZOLAM HCL 2 MG/2ML IJ SOLN
INTRAMUSCULAR | Status: AC
  Filled 2017-02-27: qty 2

## 2017-02-27 MED ORDER — OXYCODONE HCL 5 MG PO TABS
ORAL_TABLET | ORAL | Status: AC
Start: 2017-02-27 — End: ?
  Filled 2017-02-27: qty 2

## 2017-02-27 MED ORDER — FENTANYL CITRATE (PF) 250 MCG/5ML IJ SOLN
INTRAMUSCULAR | Status: DC | PRN
  Administered 2017-02-27: 25 ug via INTRAVENOUS
  Administered 2017-02-27: 100 ug via INTRAVENOUS
  Administered 2017-02-27: 50 ug via INTRAVENOUS
  Administered 2017-02-27: 25 ug via INTRAVENOUS
  Administered 2017-02-27 (×2): 50 ug via INTRAVENOUS
  Administered 2017-02-27: 25 ug via INTRAVENOUS
  Administered 2017-02-27: 50 ug via INTRAVENOUS
  Administered 2017-02-27: 25 ug via INTRAVENOUS

## 2017-02-27 MED ORDER — MEPERIDINE HCL 50 MG/ML IJ SOLN: 6.2500 mg | INTRAMUSCULAR | Status: DC | PRN

## 2017-02-27 MED ORDER — ARTIFICIAL TEARS OPHTHALMIC OINT
TOPICAL_OINTMENT | OPHTHALMIC | Status: DC | PRN
  Administered 2017-02-27: 1 via OPHTHALMIC

## 2017-02-27 MED ORDER — SIMETHICONE 80 MG PO CHEW: 80.0000 mg | CHEWABLE_TABLET | Freq: Four times a day (QID) | ORAL | Status: DC | PRN

## 2017-02-27 MED ORDER — SCOPOLAMINE 1 MG/3DAYS TD PT72
MEDICATED_PATCH | TRANSDERMAL | Status: AC
  Filled 2017-02-27: qty 1

## 2017-02-27 MED ORDER — ONDANSETRON HCL 4 MG PO TABS: 4.0000 mg | ORAL_TABLET | Freq: Four times a day (QID) | ORAL | Status: DC | PRN

## 2017-02-27 MED ORDER — IBUPROFEN 600 MG PO TABS: 600.0000 mg | ORAL_TABLET | Freq: Four times a day (QID) | ORAL | 1 refills | Status: DC | PRN

## 2017-02-27 MED ORDER — LIDOCAINE 2% (20 MG/ML) 5 ML SYRINGE
INTRAMUSCULAR | Status: AC
  Filled 2017-02-27: qty 5

## 2017-02-27 MED ORDER — KETOROLAC TROMETHAMINE 30 MG/ML IJ SOLN
30.0000 mg | Freq: Four times a day (QID) | INTRAMUSCULAR | Status: DC
  Administered 2017-02-27 – 2017-02-28 (×2): 30 mg via INTRAVENOUS

## 2017-02-27 MED ORDER — ROPIVACAINE HCL 5 MG/ML IJ SOLN
INTRAMUSCULAR | Status: AC
  Filled 2017-02-27: qty 30

## 2017-02-27 MED ORDER — SUGAMMADEX SODIUM 200 MG/2ML IV SOLN
INTRAVENOUS | Status: AC
  Filled 2017-02-27: qty 2

## 2017-02-27 MED ORDER — PHENYLEPHRINE 40 MCG/ML (10ML) SYRINGE FOR IV PUSH (FOR BLOOD PRESSURE SUPPORT)
PREFILLED_SYRINGE | INTRAVENOUS | Status: AC
  Filled 2017-02-27: qty 10

## 2017-02-27 MED ORDER — OXYCODONE-ACETAMINOPHEN 5-325 MG PO TABS: 1.0000 | ORAL_TABLET | ORAL | 0 refills | Status: AC | PRN

## 2017-02-27 MED ORDER — METOCLOPRAMIDE HCL 5 MG/ML IJ SOLN
10.0000 mg | Freq: Once | INTRAMUSCULAR | Status: AC
  Administered 2017-02-27: 10 mg via INTRAVENOUS

## 2017-02-27 MED ORDER — OXYCODONE HCL 5 MG PO TABS: 10.0000 mg | ORAL_TABLET | ORAL | Status: DC | PRN

## 2017-02-27 MED ORDER — PHENYLEPHRINE HCL 10 MG/ML IJ SOLN
INTRAMUSCULAR | Status: DC | PRN
  Administered 2017-02-27 (×5): 80 ug via INTRAVENOUS

## 2017-02-27 MED ORDER — OXYCODONE HCL 5 MG PO TABS: 5.0000 mg | ORAL_TABLET | Freq: Once | ORAL | Status: DC | PRN

## 2017-02-27 MED ORDER — EPHEDRINE 5 MG/ML INJ
INTRAVENOUS | Status: AC
  Filled 2017-02-27: qty 10

## 2017-02-27 MED ORDER — ROCURONIUM BROMIDE 100 MG/10ML IV SOLN
INTRAVENOUS | Status: DC | PRN
  Administered 2017-02-27: 10 mg via INTRAVENOUS
  Administered 2017-02-27: 50 mg via INTRAVENOUS
  Administered 2017-02-27: 20 mg via INTRAVENOUS

## 2017-02-27 MED ORDER — OXYCODONE-ACETAMINOPHEN 5-325 MG PO TABS: 2.0000 | ORAL_TABLET | ORAL | Status: DC | PRN

## 2017-02-27 MED ORDER — SUGAMMADEX SODIUM 200 MG/2ML IV SOLN
INTRAVENOUS | Status: DC | PRN
  Administered 2017-02-27: 160 mg via INTRAVENOUS

## 2017-02-27 MED ORDER — PROPOFOL 10 MG/ML IV BOLUS
INTRAVENOUS | Status: AC
  Filled 2017-02-27: qty 20

## 2017-02-27 MED ORDER — DEXAMETHASONE SODIUM PHOSPHATE 10 MG/ML IJ SOLN
INTRAMUSCULAR | Status: AC
  Filled 2017-02-27: qty 1

## 2017-02-27 MED ORDER — ACETAMINOPHEN 10 MG/ML IV SOLN
1000.0000 mg | Freq: Once | INTRAVENOUS | Status: AC
  Administered 2017-02-27: 1000 mg via INTRAVENOUS

## 2017-02-27 MED ORDER — METOCLOPRAMIDE HCL 5 MG/ML IJ SOLN
INTRAMUSCULAR | Status: AC
  Filled 2017-02-27: qty 2

## 2017-02-27 MED ORDER — PROPOFOL 10 MG/ML IV BOLUS
INTRAVENOUS | Status: DC | PRN
  Administered 2017-02-27: 170 mg via INTRAVENOUS

## 2017-02-27 MED ORDER — KETOROLAC TROMETHAMINE 30 MG/ML IJ SOLN: 30.0000 mg | Freq: Once | INTRAMUSCULAR | Status: DC

## 2017-02-27 MED ORDER — OXYCODONE HCL 5 MG/5ML PO SOLN
5.0000 mg | Freq: Once | ORAL | Status: DC | PRN
  Filled 2017-02-27: qty 5

## 2017-02-27 MED ORDER — CEFOTETAN DISODIUM-DEXTROSE 2-2.08 GM-%(50ML) IV SOLR
2.0000 g | INTRAVENOUS | Status: AC
  Administered 2017-02-27: 2 g via INTRAVENOUS
  Filled 2017-02-27: qty 50

## 2017-02-27 MED ORDER — MENTHOL 3 MG MT LOZG: 1.0000 | LOZENGE | OROMUCOSAL | Status: DC | PRN

## 2017-02-27 MED ORDER — STERILE WATER FOR IRRIGATION IR SOLN
Status: DC | PRN
  Administered 2017-02-27: 1000 mL via INTRAVESICAL

## 2017-02-27 MED ORDER — ONDANSETRON HCL 4 MG/2ML IJ SOLN
4.0000 mg | Freq: Four times a day (QID) | INTRAMUSCULAR | Status: DC | PRN
  Administered 2017-02-27: 4 mg via INTRAVENOUS

## 2017-02-27 SURGICAL SUPPLY — 59 items
ADH SKN CLS APL DERMABOND .7 (GAUZE/BANDAGES/DRESSINGS) ×1
BARRIER ADHS 3X4 INTERCEED (GAUZE/BANDAGES/DRESSINGS) ×2 IMPLANT
BRR ADH 4X3 ABS CNTRL BYND (GAUZE/BANDAGES/DRESSINGS) ×1
CANISTER SUCT 3000ML PPV (MISCELLANEOUS) ×3 IMPLANT
CATH FOLEY 3WAY  5CC 16FR (CATHETERS) ×2
CATH FOLEY 3WAY 5CC 16FR (CATHETERS) ×1 IMPLANT
COVER BACK TABLE 60X90IN (DRAPES) ×3 IMPLANT
COVER TIP SHEARS 8 DVNC (MISCELLANEOUS) ×1 IMPLANT
COVER TIP SHEARS 8MM DA VINCI (MISCELLANEOUS) ×2
DECANTER SPIKE VIAL GLASS SM (MISCELLANEOUS) ×6 IMPLANT
DEFOGGER SCOPE WARMER CLEARIFY (MISCELLANEOUS) ×3 IMPLANT
DERMABOND ADVANCED (GAUZE/BANDAGES/DRESSINGS) ×2
DERMABOND ADVANCED .7 DNX12 (GAUZE/BANDAGES/DRESSINGS) ×1 IMPLANT
DRAPE ARM DVNC X/XI (DISPOSABLE) ×4 IMPLANT
DRAPE COLUMN DVNC XI (DISPOSABLE) ×1 IMPLANT
DRAPE DA VINCI XI ARM (DISPOSABLE) ×8
DRAPE DA VINCI XI COLUMN (DISPOSABLE) ×2
DURAPREP 26ML APPLICATOR (WOUND CARE) ×3 IMPLANT
ELECT REM PT RETURN 15FT ADLT (MISCELLANEOUS) ×1 IMPLANT
GLOVE BIO SURGEON STRL SZ7 (GLOVE) ×3 IMPLANT
GLOVE BIOGEL PI IND STRL 7.0 (GLOVE) ×5 IMPLANT
GLOVE BIOGEL PI INDICATOR 7.0 (GLOVE) ×10
GLOVE ECLIPSE 6.5 STRL STRAW (GLOVE) ×9 IMPLANT
IRRIG SUCT STRYKERFLOW 2 WTIP (MISCELLANEOUS) ×3
IRRIGATION SUCT STRKRFLW 2 WTP (MISCELLANEOUS) ×1 IMPLANT
LEGGING LITHOTOMY PAIR STRL (DRAPES) ×3 IMPLANT
OBTURATOR OPTICAL STANDARD 8MM (TROCAR) ×2
OBTURATOR OPTICAL STND 8 DVNC (TROCAR) ×1
OBTURATOR OPTICALSTD 8 DVNC (TROCAR) IMPLANT
OCCLUDER COLPOPNEUMO (BALLOONS) ×3 IMPLANT
PACK ROBOT WH (CUSTOM PROCEDURE TRAY) ×3 IMPLANT
PACK ROBOTIC GOWN (GOWN DISPOSABLE) ×3 IMPLANT
PACK TRENDGUARD 450 HYBRID PRO (MISCELLANEOUS) IMPLANT
PACK TRENDGUARD 600 HYBRD PROC (MISCELLANEOUS) IMPLANT
PAD PREP 24X48 CUFFED NSTRL (MISCELLANEOUS) ×3 IMPLANT
PROTECTOR NERVE ULNAR (MISCELLANEOUS) ×9 IMPLANT
SEAL CANN UNIV 5-8 DVNC XI (MISCELLANEOUS) ×3 IMPLANT
SEAL XI 5MM-8MM UNIVERSAL (MISCELLANEOUS) ×8
SEALER VESSEL DA VINCI XI (MISCELLANEOUS) ×2
SEALER VESSEL EXT DVNC XI (MISCELLANEOUS) IMPLANT
SET CYSTO W/LG BORE CLAMP LF (SET/KITS/TRAYS/PACK) IMPLANT
SET TRI-LUMEN FLTR TB AIRSEAL (TUBING) ×3 IMPLANT
SUT MNCRL AB 3-0 PS2 27 (SUTURE) ×6 IMPLANT
SUT VIC AB 0 CT1 27 (SUTURE) ×6
SUT VIC AB 0 CT1 27XBRD ANBCTR (SUTURE) ×2 IMPLANT
SUT VICRYL 0 UR6 27IN ABS (SUTURE) ×4 IMPLANT
SUT VLOC 180 0 9IN  GS21 (SUTURE) ×2
SUT VLOC 180 0 9IN GS21 (SUTURE) ×1 IMPLANT
TIP RUMI ORANGE 6.7MMX12CM (TIP) IMPLANT
TIP UTERINE 5.1X6CM LAV DISP (MISCELLANEOUS) IMPLANT
TIP UTERINE 6.7X10CM GRN DISP (MISCELLANEOUS) IMPLANT
TIP UTERINE 6.7X6CM WHT DISP (MISCELLANEOUS) IMPLANT
TIP UTERINE 6.7X8CM BLUE DISP (MISCELLANEOUS) ×2 IMPLANT
TOWEL OR 17X24 6PK STRL BLUE (TOWEL DISPOSABLE) ×9 IMPLANT
TRENDGUARD 450 HYBRID PRO PACK (MISCELLANEOUS) ×3
TRENDGUARD 600 HYBRID PROC PK (MISCELLANEOUS)
TROCAR PORT AIRSEAL 5X120 (TROCAR) ×3 IMPLANT
TROCAR PORT AIRSEAL 8X100 (TROCAR) ×2 IMPLANT
WATER STERILE IRR 1000ML POUR (IV SOLUTION) ×3 IMPLANT

## 2017-02-27 NOTE — Anesthesia Postprocedure Evaluation (Signed)
Anesthesia Post Note  Patient: Suzanne Aguirre  Procedure(s) Performed: XI ROBOTIC ASSISTED TOTAL HYSTERECTOMY with salpingectomy (Bilateral Abdomen)     Patient location during evaluation: PACU Anesthesia Type: General Level of consciousness: awake and alert Pain management: pain level controlled Vital Signs Assessment: post-procedure vital signs reviewed and stable Respiratory status: spontaneous breathing, nonlabored ventilation, respiratory function stable and patient connected to nasal cannula oxygen Cardiovascular status: blood pressure returned to baseline and stable Postop Assessment: no apparent nausea or vomiting Anesthetic complications: no    Last Vitals:  Vitals:   02/27/17 1715 02/27/17 1811  BP: 127/64 (!) 142/66  Pulse: (!) 109 (!) 116  Resp: 14 16  Temp: 36.9 C 36.4 C  SpO2: 100% 100%    Last Pain:  Vitals:   02/27/17 1745  TempSrc:   PainSc: [name]

## 2017-02-27 NOTE — Progress Notes (Signed)
Talked with Dr Cletis Media about patient being transferred to [facility].  Patient is expected to have an extended stay and go home later this evening.  I called [place] and spoke with Davine, RN and reported that this patient would be transferred to their unit and would probably go home later on this evening.  Roberto Scales is aware.

## 2017-02-27 NOTE — Progress Notes (Addendum)
02/27/2017 7:27 PM Dr. Cletis Media paged and made aware of pt. C/o continued nausea and dry heaving without emesis. Pt. States this is consistent with prior procedures to have severe nausea post op. Telephone order received for Reglan 10 mg IV x 1. Orders enacted. Will continue to closely monitor patient.  Suzanne Aguirre, Arville Lime

## 2017-02-27 NOTE — Anesthesia Preprocedure Evaluation (Signed)
Anesthesia Evaluation  Patient identified by MRN, date of birth, ID band Patient awake    Reviewed: Allergy & Precautions, H&P , NPO status , Patient's Chart, lab work & pertinent test results, reviewed documented beta blocker date and time   History of Anesthesia Complications (+) PONVNegative for: history of anesthetic complications  Airway Mallampati: III  TM Distance: >3 FB Neck ROM: full    Dental  (+) Teeth Intact   Pulmonary neg pulmonary ROS,    breath sounds clear to auscultation       Cardiovascular Hypertension: preeclampsia on Magnesium.  Rhythm:regular Rate:Normal     Neuro/Psych negative neurological ROS  negative psych ROS   GI/Hepatic negative GI ROS, Neg liver ROS,   Endo/Other  negative endocrine ROS  Renal/GU negative Renal ROS     Musculoskeletal   Abdominal   Peds  Hematology  (+) Blood dyscrasia (hgb 9.9), anemia ,   Anesthesia Other Findings   Reproductive/Obstetrics                             Anesthesia Physical  Anesthesia Plan  ASA: II  Anesthesia Plan: General   Post-op Pain Management:    Induction: Intravenous  PONV Risk Score and Plan: 4 or greater and Ondansetron, Dexamethasone, Midazolam and Scopolamine patch - Pre-op  Airway Management Planned: Oral ETT  Additional Equipment:   Intra-op Plan:   Post-operative Plan: Extubation in OR  Informed Consent: I have reviewed the patients History and Physical, chart, labs and discussed the procedure including the risks, benefits and alternatives for the proposed anesthesia with the patient or authorized representative who has indicated his/her understanding and acceptance.   Dental advisory given  Plan Discussed with: CRNA  Anesthesia Plan Comments:         Anesthesia Quick Evaluation

## 2017-02-27 NOTE — Op Note (Signed)
Preoperative diagnosis: menorrhagia with anemia  Postoperative diagnosis: Same   Anesthesia: General  Anesthesiologist: Dr. Sabra Heck  Procedure: Robotically assisted total hysterectomy with bilateral salpingectomy  Surgeon: Dr. Katharine Look Dsean Vantol  Assistant: Earnstine Regal P.A.-C.  Estimated blood loss: 50 cc  Procedure:  After being informed of the planned procedure with possible complications including but not limited to bleeding, infection, injury to other organs, need for laparotomy, possible need for morcellation with risks and benefits reviewed, expected hospital stay and recovery, informed consent is obtained and patient is taken to or #6. She is placed in  lithotomy position on [place]reng[place] with both arms padded and tucked on each side and bilateral knee-high sequential compressive devices. She is given general anesthesia with endotracheal intubation without any complication. She is prepped and draped in a sterile fashion. A three-way Foley catheter is inserted in her bladder.  Pelvic exam reveals: retroverted uterus ,12 week size, 2 normal adnexa  A weighted speculum is inserted in the vagina and the anterior lip of the cervix is grasped with a tenaculum forcep. We proceed with a paracervical block and vaginal infiltration using ropivacaine 0.5% diluted 1 in 1 with saline. The uterus was then sounded at 9 cm. We easily dilate the cervix using Hegar dilator to  #27 which allows for easy placement of the intrauterine RUMI manipulator with a 3.5 KOH ring and a vaginal occluder. The ring is sutured to the cervix with 0 Vicryl.  Trocar placement is decided. We infiltrate the umbilicus with 10 cc of ropivacaine per protocol and perform a 10 mm semi-elliptical  incision which is brought down bluntly to the fascia. The fascia is identified and grasped with Coker forceps. The fascia is incised with Mayo scissors. Peritoneum is entered bluntly. A pursestring suture of 0 Vicryl is placed on the fascia and  a 10 mm Hassan trocar is easily inserted in the abdominal cavity held in placed with a Purstring suture. This allows for easy insufflation of a pneumoperitoneum using warmed CO2 at a maximum pressure of 15 mm of mercury. We then placed one 66mm robotic trocar on the left, one 26mm robotic trocar on the right and one 10 mm patient's side assistant trocar on the right  after infiltrating every site  with ropivacaine per protocol. The robot is docked on the right of the patient after positioning her in Trendelenburg. A monopolar scissor is inserted in arm #1, a Vessel Seal is inserted in arm #3.  Preparation and docking is completed in 54 minutes.  Observation: Anterior and posterior cul-de-sac are normal. Both tubes and ovaries are normal with a previous site of sterilization with Filschie Clips. Uterus is deeply retroverted and bulky, compatible with adenomyosis. A 3 cm sub serosal fibroid is noted on the right cornua. Liver and gallbladder are normal. Appendix is not seen.  We start on the right side by cauterizing the mesosalpynx , the right utero-ovarian ligament and the right round ligament . This is then sectioned with monopolar scissors. This gives [place] entry into the retroperitoneal space with an easy dissection of the anterior broad ligament. This was opened all the way to the left round ligament.   We then proceed with systematic dissection of the bladder over way from the anterior vaginal cuff which is easily identified with the KOH ring. The plane of dissection is easily identified and confirmed after filling the bladder with 200 cc of saline. We are able to dissect the bladder 2 cm below the KOH ring. We then opened the  posterior right broad ligament all the way to the posterior KOH ring after identifying the full course of the right ureter.   Moving to the left side we cauterize the left round ligament , the left utero-ovarian ligament and  the mesosalpinx in between. This pedicle is the sectionned.  Entry into the retroperitoneal space allows [place] to complete dissection of the bladder on the left side and skeletonized the all uterine vessels. The left broad ligament is then dissected all the way to the posterior KOH ring after identifying the full course of the left ureter.   With pressure on the KOH ring and the bladder fully dissected below we are able to cauterize the uterine vessels on both sides at the level of the KOH ring.  The vaginal occluder is inflated and we proceed with a 360 colpotomy using an open monopolar scissors and freeing the uterus entirely with its tubes..  The uterus is delivered vaginally with traction. The vaginal occluder is reinserted in the vagina to maintain pneumoperitoneum.  Instruments are then modified for a suture cut in arm #1 and a [place] in arm #2. We proceed with closure of the vaginal cuff with 2 running sutures of 0 V-Lock. We irrigated profusely with warm saline and confirm a satisfactory hemostasis as well as 2 normal ureters with good mobility and no dilatation. A sheet of Interceed is placed over the length of the vaginal cuff.  All instruments are then removed and the robot is undocked. Console time: 1 hours and 40 minutes.  All trochars are removed under direct visualization after evacuating the pneumoperitoneum.  The fascia of the supraumbilical incision is closed with the previously placed pursestring suture of 0 Vicryl. All incisions are then closed with subcuticular suture of 3-0 Monocryl and Dermabond.  A speculum is inserted in the vagina to confirm a adequate closure of the vaginal cuff and good hemostasis.  Instrument and sponge count is complete x2. Estimated blood loss is minimal. The procedure is well tolerated by the patient is taken to recovery room in a well and stable condition.  Specimen: Uterus and tubes weighing 243 g

## 2017-02-27 NOTE — Transfer of Care (Signed)
Immediate Anesthesia Transfer of Care Note  Patient: Suzanne Aguirre  Procedure(s) Performed: XI ROBOTIC ASSISTED TOTAL HYSTERECTOMY with salpingectomy (Bilateral Abdomen)  Patient Location: PACU  Anesthesia Type:General  Level of Consciousness: awake, alert , oriented, drowsy and patient cooperative  Airway & Oxygen Therapy: Patient Spontanous Breathing and Patient connected to face mask oxygen  Post-op Assessment: Report given to RN and Post -op Vital signs reviewed and stable  Post vital signs: Reviewed and stable  Last Vitals:  Vitals:   02/27/17 1010  BP: 126/68  Pulse: 89  Resp: 18  Temp: 36.9 C  SpO2: 100%    Last Pain:  Vitals:   02/27/17 1010  TempSrc: Oral         Complications: No apparent anesthesia complications

## 2017-02-27 NOTE — Progress Notes (Signed)
02/27/2017 1755 Pt. C/o continued severe nausea and pain. Pt. Already given Toradol, zofran and phenergan post op. Pt. Declines wanting to take percocet due to concerns over worsening nausea. Dr. Cletis Media paged and made aware. Telephone order received for Tylenol 1000 mg IV x 1. Orders enacted. Will continue to closely monitor patient.  Suzanne Aguirre, Arville Lime

## 2017-02-27 NOTE — Anesthesia Procedure Notes (Signed)
Procedure Name: Intubation Date/Time: 02/27/2017 12:20 PM Performed by: Raenette Rover, CRNA Pre-anesthesia Checklist: Patient identified, Emergency Drugs available, Suction available and Patient being monitored Patient Re-evaluated:Patient Re-evaluated prior to induction Oxygen Delivery Method: Circle system utilized Preoxygenation: Pre-oxygenation with 100% oxygen Induction Type: IV induction Ventilation: Mask ventilation without difficulty Laryngoscope Size: Mac and 3 Grade View: Grade I Tube type: Oral Tube size: 7.0 mm Number of attempts: 1 Airway Equipment and Method: Stylet Placement Confirmation: ETT inserted through vocal cords under direct vision,  positive ETCO2,  CO2 detector and breath sounds checked- equal and bilateral Secured at: 21 cm Tube secured with: Tape Dental Injury: Teeth and Oropharynx as per pre-operative assessment

## 2017-02-27 NOTE — Progress Notes (Addendum)
Dr Cletis Media called to check pt status. Discussed case. Informed Dr Cletis Media of persistent nausea and most recent episode of vomiting immediately after taking Ibuprofen (whole pills in emesis seen by RN). Orders received. Okay to dc in morning if/when N/V under control and tolerating po's.  Blair Hailey, RN  Addendum: Poor po intake d/t N/V. Per Dr Cletis Media, Faythe Ghee to continue IVF per previous order.  Blair Hailey, RN

## 2017-02-28 ENCOUNTER — Encounter (HOSPITAL_COMMUNITY): Payer: Self-pay | Admitting: Obstetrics and Gynecology

## 2017-02-28 DIAGNOSIS — N800 Endometriosis of uterus: Secondary | ICD-10-CM | POA: Diagnosis not present

## 2017-02-28 MED ORDER — PROMETHAZINE HCL 25 MG/ML IJ SOLN
25.0000 mg | Freq: Four times a day (QID) | INTRAMUSCULAR | Status: DC | PRN
  Administered 2017-02-28: 25 mg via INTRAVENOUS

## 2017-02-28 MED ORDER — SODIUM CHLORIDE 0.9 % IJ SOLN
INTRAMUSCULAR | Status: AC
  Filled 2017-02-28: qty 10

## 2017-02-28 MED ORDER — ONDANSETRON HCL 4 MG/2ML IJ SOLN
INTRAMUSCULAR | Status: AC
  Filled 2017-02-28: qty 2

## 2017-02-28 MED ORDER — KETOROLAC TROMETHAMINE 30 MG/ML IJ SOLN
INTRAMUSCULAR | Status: AC
  Filled 2017-02-28: qty 1

## 2017-02-28 MED ORDER — PROMETHAZINE HCL 25 MG/ML IJ SOLN
INTRAMUSCULAR | Status: AC
  Filled 2017-02-28: qty 1

## 2017-02-28 NOTE — Discharge Instructions (Signed)
POST-OPERATIVE INSTRUCTIONS TO PATIENT  Call [facility]  [phone])  for excessive pain, bleeding or temperature greater than or equal to 100.4 degrees (orally).    No driving for 1 week No lifting (more than 20 lbs) for 6 weeks No sexual activity for 6 weeks  Pain management:  Use Ibuprofen 600 mg every 6 hours for 5 days and then as needed. Use your pain medication as needed to maintain a pain level at or below 3/10 Use Colace 1-2 capsules per day as long as you are using pain medication to avoid constipation.       Diet: normal  Bathing: may shower day after surgery  Wound Care: keep incisions clean and dry  Return to Dr. Cletis Media on as scheduled  Return to work: To be determined at post operative visit.    Dede Query Kamika Goodloe MD 02/28/2017 7:18 AM

## 2017-02-28 NOTE — Progress Notes (Signed)
Telephone call to Dr Cletis Media. Informed MD that pt is able to ambulate, tolerated solid PO breakfast without nausea, is voiding, and pain is managed. MD aware that pt received IV toradol but no PO pain meds overnight due to N/V.  Per Dr Cletis Media, pt cleared to discharge. RX already sent to pharmacy, follow-up visits already set up (MD did not know dates at time of call), and pt should continue Niferex as per home dose.  Blair Hailey, RN

## 2019-10-27 ENCOUNTER — Other Ambulatory Visit: Payer: Self-pay

## 2019-10-27 ENCOUNTER — Emergency Department (HOSPITAL_COMMUNITY): Payer: BC Managed Care – PPO

## 2019-10-27 ENCOUNTER — Encounter (HOSPITAL_COMMUNITY): Payer: Self-pay

## 2019-10-27 ENCOUNTER — Emergency Department (HOSPITAL_COMMUNITY)
Admission: EM | Admit: 2019-10-27 | Discharge: 2019-10-27 | Disposition: A | Discharge status: Home / Self Care | Payer: BC Managed Care – PPO | Attending: Emergency Medicine | Admitting: Emergency Medicine

## 2019-10-27 DIAGNOSIS — R0789 Other chest pain: Secondary | ICD-10-CM | POA: Diagnosis not present

## 2019-10-27 DIAGNOSIS — I10 Essential (primary) hypertension: Secondary | ICD-10-CM | POA: Insufficient documentation

## 2019-10-27 DIAGNOSIS — R102 Pelvic and perineal pain: Secondary | ICD-10-CM | POA: Insufficient documentation

## 2019-10-27 DIAGNOSIS — S40812A Abrasion of left upper arm, initial encounter: Secondary | ICD-10-CM | POA: Insufficient documentation

## 2019-10-27 MED ORDER — IBUPROFEN 600 MG PO TABS: 600.0000 mg | ORAL_TABLET | Freq: Four times a day (QID) | ORAL | 0 refills | Status: AC | PRN

## 2019-10-27 MED ORDER — METHOCARBAMOL 500 MG PO TABS: 500.0000 mg | ORAL_TABLET | Freq: Two times a day (BID) | ORAL | 0 refills | Status: AC

## 2019-10-27 MED ORDER — KETOROLAC TROMETHAMINE 30 MG/ML IJ SOLN
30.0000 mg | Freq: Once | INTRAMUSCULAR | Status: AC
  Administered 2019-10-27: 30 mg via INTRAVENOUS
  Filled 2019-10-27: qty 1

## 2019-10-27 MED ORDER — CYCLOBENZAPRINE HCL 10 MG PO TABS
10.0000 mg | ORAL_TABLET | Freq: Once | ORAL | Status: AC
Start: 2019-10-27 — End: 2019-10-27
  Administered 2019-10-27: 10 mg via ORAL
  Filled 2019-10-27: qty 1
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
## 2020-12-03 ENCOUNTER — Other Ambulatory Visit: Payer: Self-pay

## 2020-12-03 ENCOUNTER — Encounter (HOSPITAL_BASED_OUTPATIENT_CLINIC_OR_DEPARTMENT_OTHER): Payer: Self-pay | Admitting: Emergency Medicine

## 2020-12-03 ENCOUNTER — Emergency Department (HOSPITAL_BASED_OUTPATIENT_CLINIC_OR_DEPARTMENT_OTHER)
Admission: EM | Admit: 2020-12-03 | Discharge: 2020-12-03 | Disposition: A | Discharge status: Home / Self Care | Payer: BC Managed Care – PPO | Attending: Emergency Medicine | Admitting: Emergency Medicine

## 2020-12-03 DIAGNOSIS — I10 Essential (primary) hypertension: Secondary | ICD-10-CM | POA: Insufficient documentation

## 2020-12-03 DIAGNOSIS — J101 Influenza due to other identified influenza virus with other respiratory manifestations: Secondary | ICD-10-CM | POA: Insufficient documentation

## 2020-12-03 DIAGNOSIS — R112 Nausea with vomiting, unspecified: Secondary | ICD-10-CM | POA: Diagnosis present

## 2020-12-03 DIAGNOSIS — Z20822 Contact with and (suspected) exposure to covid-19: Secondary | ICD-10-CM | POA: Insufficient documentation

## 2020-12-03 LAB — BASIC METABOLIC PANEL
Anion gap: 8 (ref 5–15)
BUN: 7 mg/dL (ref 6–20)
CO2: 26 mmol/L (ref 22–32)
Calcium: 8.8 mg/dL — ABNORMAL LOW (ref 8.9–10.3)
Chloride: 104 mmol/L (ref 98–111)
Creatinine, Ser: 0.65 mg/dL (ref 0.44–1.00)
GFR, Estimated: >60 mL/min (ref >60)
Glucose, Bld: 98 mg/dL (ref 70–99)
Potassium: 3.7 mmol/L (ref 3.5–5.1)
Sodium: 138 mmol/L (ref 135–145)

## 2020-12-03 LAB — CBC
HCT: 38.6 % (ref 36.0–46.0)
Hemoglobin: 12.6 g/dL (ref 12.0–15.0)
MCH: 30 pg (ref 26.0–34.0)
MCHC: 32.6 g/dL (ref 30.0–36.0)
MCV: 91.9 fL (ref 80.0–100.0)
Platelets: 165 10*3/uL (ref 150–400)
RBC: 4.2 MIL/uL (ref 3.87–5.11)
RDW: 12.9 % (ref 11.5–15.5)
WBC: 5.6 10*3/uL (ref 4.0–10.5)
nRBC: 0 % (ref 0.0–0.2)

## 2020-12-03 LAB — RESP PANEL BY RT-PCR (FLU A&B, COVID) ARPGX2
Influenza A by PCR: POSITIVE — AB
Influenza B by PCR: NEGATIVE
SARS Coronavirus 2 by RT PCR: NEGATIVE

## 2020-12-03 MED ORDER — OSELTAMIVIR PHOSPHATE 75 MG PO CAPS: 75.0000 mg | ORAL_CAPSULE | Freq: Two times a day (BID) | ORAL | 0 refills | Status: AC

## 2020-12-03 MED ORDER — IBUPROFEN 800 MG PO TABS
800.0000 mg | ORAL_TABLET | Freq: Once | ORAL | Status: AC
  Administered 2020-12-03: 23:00:00 800 mg via ORAL

## 2020-12-03 MED ORDER — ONDANSETRON 4 MG PO TBDP
ORAL_TABLET | ORAL | Status: AC
  Filled 2020-12-03: qty 1

## 2020-12-03 MED ORDER — IBUPROFEN 800 MG PO TABS
ORAL_TABLET | ORAL | Status: AC
  Filled 2020-12-03: qty 1

## 2020-12-03 MED ORDER — ONDANSETRON 4 MG PO TBDP: 4.0000 mg | ORAL_TABLET | Freq: Three times a day (TID) | ORAL | 0 refills | Status: AC | PRN

## 2020-12-03 MED ORDER — ONDANSETRON 4 MG PO TBDP
4.0000 mg | ORAL_TABLET | Freq: Once | ORAL | Status: AC
  Administered 2020-12-03: 23:00:00 4 mg via ORAL

## 2020-12-03 NOTE — ED Provider Notes (Signed)
[facility] EMERGENCY DEPARTMENT Provider Note   CSN: [id number] Arrival date & time: 12/03/20  1708     History Chief Complaint  Patient presents with   Fatigue   Dizziness   Chills    Suzanne Aguirre is a 50 y.o. female.  HPI     This is a 50 year old female with a history of symptomatic anemia who presents with 1 day history of generalized fatigue, chills, nausea, vomiting.  Patient reports onset of symptoms within the last 24 hours.  She is unsure whether this is related to her anemia.  She reports multiple episodes of nonbilious, nonbloody emesis.  She reports chills without documented fevers.  No known sick contacts or COVID exposures.  Denies abdominal pain, chest pain, shortness of breath.  Past Medical History:  Diagnosis Date   Heart murmur    History of gestational hypertension    PONV (postoperative nausea and vomiting)    Symptomatic anemia 05/14/2016   last transfusion 11/ 2018  x3 units RBCs and IV Iron    Patient Active Problem List   Diagnosis Date Noted   Menorrhagia 02/27/2017   Acute blood loss anemia 11/22/2016   Symptomatic anemia 05/08/2015   Chest pressure 05/08/2015   Hypertension 05/08/2015   Hypermenorrhea 05/08/2015    Past Surgical History:  Procedure Laterality Date   COLPOSCOPY     INTRAUTERINE DEVICE INSERTION  2012 approx.   ROBOTIC ASSISTED LAPAROSCOPIC HYSTERECTOMY AND SALPINGECTOMY Bilateral 02/27/2017   Procedure: XI ROBOTIC ASSISTED TOTAL HYSTERECTOMY with salpingectomy;  Surgeon: Delsa Bern, MD;  Location: [facility];  Service: Gynecology;  Laterality: Bilateral;   TUBAL LIGATION Bilateral 05/30/2011     OB History     Gravida  4   Para  4   Term  4   Preterm      AB      Living  4      SAB      IAB      Ectopic      Multiple      Live Births  4           Family History  Problem Relation Age of Onset   Hypertension Mother    Hypertension Father    Diabetes Mellitus II  Father    Prostate cancer Father    Hypertension Paternal Grandmother    Hypertension Paternal Grandfather     Social History   Tobacco Use   Smoking status: Never   Smokeless tobacco: Never  Vaping Use   Vaping Use: Never used  Substance Use Topics   Alcohol use: Yes    Comment: occasional   Drug use: No    Home Medications Prior to Admission medications   Medication Sig Start Date End Date Taking? Authorizing Provider  ondansetron (ZOFRAN-ODT) 4 MG disintegrating tablet Take 1 tablet (4 mg total) by mouth every 8 (eight) hours as needed for nausea or vomiting. 12/03/20  Yes Dhyana Bastone, Barbette Hair, MD  oseltamivir (TAMIFLU) 75 MG capsule Take 1 capsule (75 mg total) by mouth every 12 (twelve) hours. 12/03/20  Yes Olamide Carattini, Barbette Hair, MD  Apple Cider Vinegar 600 MG CAPS Take 600 mg by mouth in the morning and at bedtime.    [provider]  ibuprofen (IBU) 600 MG tablet Take 1 tablet (600 mg total) by mouth every 6 (six) hours as needed. 10/27/19   Corena Herter, PA-C  iron polysaccharides (NIFEREX) 150 MG capsule Take 1 capsule (150 mg total) daily by  mouth. Patient not taking: Reported on 10/27/2019 11/23/16   Rai, Vernelle Emerald, MD  methocarbamol (ROBAXIN) 500 MG tablet Take 1 tablet (500 mg total) by mouth 2 (two) times daily. 10/27/19   Corena Herter, PA-C  Multiple Vitamins-Minerals (MULTIVITAMIN WITH MINERALS) tablet Take 1 tablet by mouth daily.    [provider]  oxyCODONE-acetaminophen (PERCOCET/ROXICET) 5-325 MG tablet Take 1-2 tablets by mouth every 4 (four) hours as needed for severe pain. Patient not taking: Reported on 10/27/2019 02/27/17   Delsa Bern, MD    Allergies    Fish allergy, Doxycycline, and Meclizine  Review of Systems   Review of Systems  Constitutional:  Positive for chills and fatigue. Negative for fever.  Respiratory:  Negative for cough and shortness of breath.   Cardiovascular:  Negative for chest pain.  Gastrointestinal:   Positive for nausea and vomiting. Negative for abdominal pain and diarrhea.  All other systems reviewed and are negative.  Physical Exam Updated Vital Signs BP (!) 117/101 (BP Location: Right Arm)   Pulse 96   Temp 97.9 F (36.6 C) (Oral)   Resp 17   Ht 1.651 m (5\' 5" )   Wt 83.9 kg   LMP 02/13/2017 (Approximate) Comment: per pt have been bleeding since jan 2018  SpO2 100%   BMI 30.79 kg/m   Physical Exam Vitals and nursing note reviewed.  Constitutional:      Appearance: She is well-developed. She is not toxic-appearing.  HENT:     Head: Normocephalic and atraumatic.     Nose: Nose normal.     Mouth/Throat:     Mouth: Mucous membranes are moist.  Eyes:     Pupils: Pupils are equal, round, and reactive to light.  Cardiovascular:     Rate and Rhythm: Normal rate and regular rhythm.     Heart sounds: Normal heart sounds.  Pulmonary:     Effort: Pulmonary effort is normal. No respiratory distress.     Breath sounds: No wheezing.  Abdominal:     General: Bowel sounds are normal.     Palpations: Abdomen is soft.     Tenderness: There is no abdominal tenderness. There is no guarding.  Musculoskeletal:     Cervical back: Neck supple.     Right lower leg: No edema.     Left lower leg: No edema.  Skin:    General: Skin is warm and dry.  Neurological:     Mental Status: She is alert and oriented to person, place, and time.  Psychiatric:        Mood and Affect: Mood normal.    ED Results / Procedures / Treatments   Labs (all labs ordered are listed, but only abnormal results are displayed) Labs Reviewed  RESP PANEL BY RT-PCR (FLU A&B, COVID) ARPGX2 - Abnormal; Notable for the following components:      Result Value   Influenza A by PCR POSITIVE (*)    All other components within normal limits  BASIC METABOLIC PANEL - Abnormal; Notable for the following components:   Calcium 8.8 (*)    All other components within normal limits  CBC    EKG None  Radiology No  results found.  Procedures Procedures   Medications Ordered in ED Medications  ibuprofen (ADVIL) tablet 800 mg (800 mg Oral Given 12/03/20 2319)  ondansetron (ZOFRAN-ODT) disintegrating tablet 4 mg (4 mg Oral Given 12/03/20 2319)    ED Course  I have reviewed the triage vital signs and the nursing notes.  Pertinent labs & imaging results that were available during my care of the patient were reviewed by me and considered in my medical decision making (see chart for details).    MDM Rules/Calculators/A&P                           Patient presents with fatigue, chills, nausea and vomiting.  She is nontoxic and vital signs initially notable for temperature of 100.5 and a pulse rate of 114.  On my evaluation, she has no reproducible abdominal tenderness.  She appears well-hydrated at this time.  Testing reviewed.  She is flu positive.  Hemoglobin is within normal limits.  Doubt symptomatic anemia at this time.  Suspect her symptoms are related to acute influenza.  We discussed Tamiflu and supportive measures at home including hydration, ibuprofen, Tylenol, and Zofran.  After history, exam, and medical workup I feel the patient has been appropriately medically screened and is safe for discharge home. Pertinent diagnoses were discussed with the patient. Patient was given return precautions.  Final Clinical Impression(s) / ED Diagnoses Final diagnoses:  Influenza A    Rx / DC Orders ED Discharge Orders          Ordered    ondansetron (ZOFRAN-ODT) 4 MG disintegrating tablet  Every 8 hours PRN        12/03/20 2340    oseltamivir (TAMIFLU) 75 MG capsule  Every 12 hours        12/03/20 2341             Merryl Hacker, MD 12/03/20 2346

## 2020-12-03 NOTE — Discharge Instructions (Signed)
You were seen today for upper respiratory, nausea, vomiting, and fatigue.  You tested positive for influenza A.  Make sure that you are staying hydrated.  Take Tylenol or Motrin for body aches and pains or fever.  You can take Zofran for nausea.

## 2020-12-03 NOTE — ED Triage Notes (Signed)
Patient has history of anemia and states she feels that her HGB is low. Patient states she also feels that she is coming down with a cold - she is unsure if she is fatigued due to anemia or an illness. Patient states she has had a slow nose bleed.

## 2020-12-03 NOTE — ED Notes (Signed)
Pt tolerating PO intake at this time. Pt ambulatory with steady gait.
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
## 2023-03-10 ENCOUNTER — Other Ambulatory Visit: Payer: Self-pay

## 2023-03-10 ENCOUNTER — Ambulatory Visit
Admission: EM | Admit: 2023-03-10 | Discharge: 2023-03-10 | Disposition: A | Discharge status: Home / Self Care | Attending: Family Medicine | Admitting: Family Medicine

## 2023-03-10 ENCOUNTER — Encounter: Payer: Self-pay | Admitting: Emergency Medicine

## 2023-03-10 DIAGNOSIS — B349 Viral infection, unspecified: Secondary | ICD-10-CM

## 2023-03-10 LAB — POC COVID19/FLU A&B COMBO
Covid Antigen, POC: NEGATIVE
Influenza A Antigen, POC: NEGATIVE
Influenza B Antigen, POC: NEGATIVE

## 2023-03-10 NOTE — ED Triage Notes (Signed)
 Patient c/o flu like symptoms: chills, cough, weakness, vomiting x Sunday.

## 2023-03-10 NOTE — ED Provider Notes (Signed)
 [facility]    CSN: [id number] Arrival date & time: 03/10/23  1046      History   Chief Complaint No chief complaint on file.   HPI Suzanne Aguirre is a 53 y.o. female  presents for evaluation of URI symptoms for 3 days. Patient reports associated symptoms of cough, congestion, fatigue, chills, nausea and vomiting. Denies diarrhea, fevers, sore throat, body aches, shortness of breath. Patient does not have a hx of asthma. Patient is not an active smoker.   Reports sick cannot see a family.  Pt has taken nothing OTC for symptoms. Pt has no other concerns at this time.   HPI  Past Medical History:  Diagnosis Date   Heart murmur    History of gestational hypertension    PONV (postoperative nausea and vomiting)    Symptomatic anemia 05/14/2016   last transfusion 11/ 2018  x3 units RBCs and IV Iron    Patient Active Problem List   Diagnosis Date Noted   Menorrhagia 02/27/2017   Acute blood loss anemia 11/22/2016   Symptomatic anemia 05/08/2015   Chest pressure 05/08/2015   Hypertension 05/08/2015   Hypermenorrhea 05/08/2015    Past Surgical History:  Procedure Laterality Date   COLPOSCOPY     INTRAUTERINE DEVICE INSERTION  2012 approx.   ROBOTIC ASSISTED LAPAROSCOPIC HYSTERECTOMY AND SALPINGECTOMY Bilateral 02/27/2017   Procedure: XI ROBOTIC ASSISTED TOTAL HYSTERECTOMY with salpingectomy;  Surgeon: Silverio Lay, MD;  Location: [facility];  Service: Gynecology;  Laterality: Bilateral;   TUBAL LIGATION Bilateral 05/30/2011    OB History     Gravida  4   Para  4   Term  4   Preterm      AB      Living  4      SAB      IAB      Ectopic      Multiple      Live Births  4            Home Medications    Prior to Admission medications   Medication Sig Start Date End Date Taking? Authorizing Provider  Apple Cider Vinegar 600 MG CAPS Take 600 mg by mouth in the morning and at bedtime.    [provider]  ibuprofen (IBU) 600  MG tablet Take 1 tablet (600 mg total) by mouth every 6 (six) hours as needed. 10/27/19   Lorelee New, PA-C  iron polysaccharides (NIFEREX) 150 MG capsule Take 1 capsule (150 mg total) daily by mouth. Patient not taking: Reported on 10/27/2019 11/23/16   Rai, Delene Ruffini, MD  methocarbamol (ROBAXIN) 500 MG tablet Take 1 tablet (500 mg total) by mouth 2 (two) times daily. 10/27/19   Lorelee New, PA-C  Multiple Vitamins-Minerals (MULTIVITAMIN WITH MINERALS) tablet Take 1 tablet by mouth daily.    [provider]  ondansetron (ZOFRAN-ODT) 4 MG disintegrating tablet Take 1 tablet (4 mg total) by mouth every 8 (eight) hours as needed for nausea or vomiting. 12/03/20   Horton, Latresha Yahr Masker, MD  oseltamivir (TAMIFLU) 75 MG capsule Take 1 capsule (75 mg total) by mouth every 12 (twelve) hours. 12/03/20   Horton, Slyvia Lartigue Masker, MD  oxyCODONE-acetaminophen (PERCOCET/ROXICET) 5-325 MG tablet Take 1-2 tablets by mouth every 4 (four) hours as needed for severe pain. Patient not taking: Reported on 10/27/2019 02/27/17   Silverio Lay, MD    Family History Family History  Problem Relation Age of Onset   Hypertension Mother  Hypertension Father    Diabetes Mellitus II Father    Prostate cancer Father    Hypertension Paternal Grandmother    Hypertension Paternal Grandfather     Social History Social History   Tobacco Use   Smoking status: Never   Smokeless tobacco: Never  Vaping Use   Vaping status: Never Used  Substance Use Topics   Alcohol use: Yes    Comment: occasional   Drug use: No     Allergies   Fish allergy, Doxycycline, and Meclizine   Review of Systems Review of Systems  Constitutional:  Positive for chills and fatigue.  HENT:  Positive for congestion.   Respiratory:  Positive for cough.   Gastrointestinal:  Positive for nausea and vomiting.     Physical Exam Triage Vital Signs ED Triage Vitals [03/10/23 1120]  Encounter Vitals Group     BP 103/72      Systolic BP Percentile      Diastolic BP Percentile      Pulse Rate 78     Resp 18     Temp 98.1 F (36.7 C)     Temp Source Oral     SpO2 98 %     Weight      Height      Head Circumference      Peak Flow      Pain Score 0     Pain Loc      Pain Education      Exclude from Growth Chart    No data found.  Updated Vital Signs BP 103/72 (BP Location: Right Arm)   Pulse 78   Temp 98.1 F (36.7 C) (Oral)   Resp 18   LMP 02/13/2017 (Approximate) Comment: per pt have been bleeding since jan 2018  SpO2 98%   Visual Acuity Right Eye Distance:   Left Eye Distance:   Bilateral Distance:    Right Eye Near:   Left Eye Near:    Bilateral Near:     Physical Exam Vitals and nursing note reviewed.  Constitutional:      General: She is not in acute distress.    Appearance: She is well-developed. She is not ill-appearing.  HENT:     Head: Normocephalic and atraumatic.     Right Ear: Tympanic membrane and ear canal normal.     Left Ear: Tympanic membrane and ear canal normal.     Nose: Congestion present.     Mouth/Throat:     Mouth: Mucous membranes are moist.     Pharynx: Oropharynx is clear. Uvula midline. No oropharyngeal exudate or posterior oropharyngeal erythema.     Tonsils: No tonsillar exudate or tonsillar abscesses.  Eyes:     Conjunctiva/sclera: Conjunctivae normal.     Pupils: Pupils are equal, round, and reactive to light.  Cardiovascular:     Rate and Rhythm: Normal rate and regular rhythm.     Heart sounds: Normal heart sounds.  Pulmonary:     Effort: Pulmonary effort is normal.     Breath sounds: Normal breath sounds.  Abdominal:     General: Bowel sounds are normal. There is no distension.     Palpations: Abdomen is soft.     Tenderness: There is no abdominal tenderness. There is no guarding.  Musculoskeletal:     Cervical back: Normal range of motion and neck supple.  Lymphadenopathy:     Cervical: No cervical adenopathy.  Skin:    General: Skin is  warm and dry.  Neurological:  General: No focal deficit present.     Mental Status: She is alert and oriented to person, place, and time.  Psychiatric:        Mood and Affect: Mood normal.        Behavior: Behavior normal.      UC Treatments / Results  Labs (all labs ordered are listed, but only abnormal results are displayed) Labs Reviewed  POC COVID19/FLU A&B COMBO - Normal    EKG   Radiology No results found.  Procedures Procedures (including critical care time)  Medications Ordered in UC Medications - No data to display  Initial Impression / Assessment and Plan / UC Course  I have reviewed the triage vital signs and the nursing notes.  Pertinent labs & imaging results that were available during my care of the patient were reviewed by me and considered in my medical decision making (see chart for details).     I reviewed exam and symptoms with patient.  No red flags.  Negative rapid flu and COVID testing.  Discussed viral illness and symptomatic treatment.  Patient declines cough medicine or nausea medicine preferring to use OTC treatments.  Advised fluids and rest.  PCP follow-up if symptoms do not improve.  ER precautions reviewed and patient verbalized understanding. Final Clinical Impressions(s) / UC Diagnoses   Final diagnoses:  Viral illness     Discharge Instructions      Please treat your symptoms with over the counter cough medication, tylenol or ibuprofen, humidifier, and rest. Viral illnesses can last 7-14 days. Please follow up with your PCP if your symptoms are not improving. Please go to the ER for any worsening symptoms. This includes but is not limited to fever you can not control with tylenol or ibuprofen, you are not able to stay hydrated, you have shortness of breath or chest pain.  Thank you for choosing [facility] for your healthcare needs. I hope you feel better soon!      ED Prescriptions   None    PDMP not reviewed this  encounter.   Radford Pax, NP 03/10/23 [phone]

## 2023-03-10 NOTE — Discharge Instructions (Addendum)
